# Patient Record
Sex: Female | Born: 1995 | Race: Black or African American | Hispanic: No | Smoking: Former smoker
Health system: Southern US, Community
[De-identification: ages and names within clinical notes are randomized; demographics above are authoritative.]

## PROBLEM LIST (undated history)

## (undated) ENCOUNTER — Inpatient Hospital Stay (HOSPITAL_COMMUNITY): Payer: Self-pay

## (undated) ENCOUNTER — Ambulatory Visit: Admission: EM | Source: Home / Self Care

## (undated) DIAGNOSIS — B009 Herpesviral infection, unspecified: Secondary | ICD-10-CM

## (undated) HISTORY — DX: Herpesviral infection, unspecified: B00.9

---
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
## 2015-04-17 ENCOUNTER — Encounter (HOSPITAL_COMMUNITY): Payer: Self-pay | Admitting: Emergency Medicine

## 2015-04-17 ENCOUNTER — Emergency Department (HOSPITAL_COMMUNITY)
Admission: EM | Admit: 2015-04-17 | Discharge: 2015-04-17 | Discharge status: Against Medical Advice | Payer: Self-pay | Attending: Emergency Medicine | Admitting: Emergency Medicine

## 2015-04-17 DIAGNOSIS — Z720 Tobacco use: Secondary | ICD-10-CM | POA: Insufficient documentation

## 2015-04-17 DIAGNOSIS — R21 Rash and other nonspecific skin eruption: Secondary | ICD-10-CM | POA: Insufficient documentation

## 2015-04-17 NOTE — ED Notes (Signed)
Pt.has been called x3 didn't answer .to go the back to a room. Pt.left

## 2015-04-17 NOTE — ED Notes (Signed)
Patient here with complaint of rash on left lateral abdomen and left leg. Rash appears as small area of dried blisters. Patient states they do not hurt unless pressed on or rubbed against. Denies fevers. Reports period is late and she is concerned about possible pregnancy.

## 2015-07-30 ENCOUNTER — Encounter (HOSPITAL_COMMUNITY): Payer: Self-pay | Admitting: Emergency Medicine

## 2015-07-30 ENCOUNTER — Emergency Department (HOSPITAL_COMMUNITY)
Admission: EM | Admit: 2015-07-30 | Discharge: 2015-07-30 | Disposition: A | Discharge status: Home / Self Care | Payer: 59 | Attending: Emergency Medicine | Admitting: Emergency Medicine

## 2015-07-30 DIAGNOSIS — R1030 Lower abdominal pain, unspecified: Secondary | ICD-10-CM | POA: Insufficient documentation

## 2015-07-30 DIAGNOSIS — R350 Frequency of micturition: Secondary | ICD-10-CM | POA: Insufficient documentation

## 2015-07-30 DIAGNOSIS — Z720 Tobacco use: Secondary | ICD-10-CM | POA: Insufficient documentation

## 2015-07-30 DIAGNOSIS — N898 Other specified noninflammatory disorders of vagina: Secondary | ICD-10-CM | POA: Insufficient documentation

## 2015-07-30 DIAGNOSIS — R300 Dysuria: Secondary | ICD-10-CM

## 2015-07-30 LAB — WET PREP, GENITAL
Clue Cells Wet Prep HPF POC: NONE SEEN
TRICH WET PREP: NONE SEEN
YEAST WET PREP: NONE SEEN

## 2015-07-30 LAB — URINALYSIS, ROUTINE W REFLEX MICROSCOPIC
BILIRUBIN URINE: NEGATIVE
Glucose, UA: NEGATIVE mg/dL
KETONES UR: NEGATIVE mg/dL
NITRITE: NEGATIVE
PH: 6 (ref 5.0–8.0)
PROTEIN: NEGATIVE mg/dL
Specific Gravity, Urine: 1.023 (ref 1.005–1.030)
Urobilinogen, UA: 1 mg/dL (ref 0.0–1.0)

## 2015-07-30 LAB — URINE MICROSCOPIC-ADD ON

## 2015-07-30 LAB — PREGNANCY, URINE: Preg Test, Ur: NEGATIVE

## 2015-07-30 MED ORDER — PHENAZOPYRIDINE HCL 200 MG PO TABS: 200.0000 mg | ORAL_TABLET | Freq: Three times a day (TID) | ORAL | Status: DC

## 2015-07-30 MED ORDER — SULFAMETHOXAZOLE-TRIMETHOPRIM 800-160 MG PO TABS: 1.0000 | ORAL_TABLET | Freq: Two times a day (BID) | ORAL | Status: AC

## 2015-07-30 NOTE — ED Notes (Signed)
Pt is in stable condition upon d/c and ambulates from ED. 

## 2015-07-30 NOTE — Discharge Instructions (Signed)
Read the information below.  Use the prescribed medication as directed.  Please discuss all new medications with your pharmacist.  You may return to the Emergency Department at any time for worsening condition or any new symptoms that concern you.   If you develop high fevers, worsening abdominal pain, uncontrolled vomiting, or are unable to tolerate fluids by mouth, return to the ER for a recheck.     Dysuria Dysuria is pain or discomfort while urinating. The pain or discomfort may be felt in the tube that carries urine out of the bladder (urethra) or in the surrounding tissue of the genitals. The pain may also be felt in the groin area, lower abdomen, and lower back. You may have to urinate frequently or have the sudden feeling that you have to urinate (urgency). Dysuria can affect both men and women, but is more common in women. Dysuria can be caused by many different things, including:  Urinary tract infection in women.  Infection of the kidney or bladder.  Kidney stones or bladder stones.  Certain sexually transmitted infections (STIs), such as chlamydia.  Dehydration.  Inflammation of the vagina.  Use of certain medicines.  Use of certain soaps or scented products that cause irritation. HOME CARE INSTRUCTIONS Watch your dysuria for any changes. The following actions may help to reduce any discomfort you are feeling:  Drink enough fluid to keep your urine clear or pale yellow.  Empty your bladder often. Avoid holding urine for long periods of time.  After a bowel movement or urination, women should cleanse from front to back, using each tissue only once.  Empty your bladder after sexual intercourse.  Take medicines only as directed by your health care provider.  If you were prescribed an antibiotic medicine, finish it all even if you start to feel better.  Avoid caffeine, tea, and alcohol. They can irritate the bladder and make dysuria worse. In men, alcohol may irritate the  prostate.  Keep all follow-up visits as directed by your health care provider. This is important.  If you had any tests done to find the cause of dysuria, it is your responsibility to obtain your test results. Ask the lab or department performing the test when and how you will get your results. Talk with your health care provider if you have any questions about your results. SEEK MEDICAL CARE IF:  You develop pain in your back or sides.  You have a fever.  You have nausea or vomiting.  You have blood in your urine.  You are not urinating as often as you usually do. SEEK IMMEDIATE MEDICAL CARE IF:  You pain is severe and not relieved with medicines.  You are unable to hold down any fluids.  You or someone else notices a change in your mental function.  You have a rapid heartbeat at rest.  You have shaking or chills.  You feel extremely weak.   This information is not intended to replace advice given to you by your health care provider. Make sure you discuss any questions you have with your health care provider.   Document Released: 06/08/2004 Document Revised: 10/01/2014 Document Reviewed: 05/06/2014 Elsevier Interactive Patient Education [employer]2016 Elsevier Inc.

## 2015-07-30 NOTE — ED Notes (Signed)
Patient reports blood in her urine and lower abdominal pain since yesterday. Pt denies n/v or fever.

## 2015-07-30 NOTE — ED Notes (Signed)
Pt states results need to be called to [phone].

## 2015-07-30 NOTE — ED Provider Notes (Signed)
CSN: [id number]     Arrival date & time 07/30/15  4098 History   First MD Initiated Contact with Patient 07/30/15 [phone]     Chief Complaint  Patient presents with  . Hematuria     (Consider location/radiation/quality/duration/timing/severity/associated sxs/prior Treatment) The history is provided by the patient.     Pt presents with suprapubic pain, urinary frequency, urgency, dysuria that began last night.  Saw blood on the toilet paper after urinating.  Unsure if vaginal or urethral blood.  Has had abnormal vaginal discharge x 2 weeks, described as dark.  LMP 07/15/15 was on time and normal.  Is sexually active, does not use protection.  Denies known fevers, N/V, change in bowel habits.   History reviewed. No pertinent past medical history. History reviewed. No pertinent past surgical history. Family History  Problem Relation Age of Onset  . Hypertension Father    Social History  Substance Use Topics  . Smoking status: Current Every Day Smoker    Types: Cigarettes  . Smokeless tobacco: None  . Alcohol Use: No   OB History    No data available     Review of Systems  All other systems reviewed and are negative.     Allergies  Review of patient's allergies indicates no known allergies.  Home Medications   Prior to Admission medications   Medication Sig Start Date End Date Taking? Authorizing Provider  ciprofloxacin (CIPRO) 500 MG tablet Take 1 tablet (500 mg total) by mouth 2 (two) times daily. Patient not taking: Reported on 07/30/2015 08/12/13   Marny Lowenstein, PA-C   BP 128/83 mmHg  Pulse 73  Temp(Src) 97.1 F (36.2 C) (Oral)  Resp 12  SpO2 99%  LMP 07/15/2015 (Exact Date) Physical Exam  Constitutional: She appears well-developed and well-nourished. No distress.  HENT:  Head: Normocephalic and atraumatic.  Neck: Neck supple.  Pulmonary/Chest: Effort normal.  Abdominal: Soft. She exhibits no distension and no mass. There is tenderness in the suprapubic  area. There is no rebound and no guarding.  Genitourinary: Uterus is tender. Cervix exhibits no motion tenderness. Right adnexum displays no mass and no tenderness. Left adnexum displays no mass, no tenderness and no fullness. No erythema, tenderness or bleeding in the vagina. No foreign body around the vagina. No signs of injury around the vagina. Vaginal discharge found.  Small amount thick/dry white discharge.  Neurological: She is alert.  Skin: She is not diaphoretic.  Nursing note and vitals reviewed.   ED Course  Procedures (including critical care time) Labs Review Labs Reviewed  WET PREP, GENITAL - Abnormal; Notable for the following:    WBC, Wet Prep HPF POC FEW (*)    All other components within normal limits  URINALYSIS, ROUTINE W REFLEX MICROSCOPIC (NOT AT [facility]) - Abnormal; Notable for the following:    APPearance CLOUDY (*)    Hgb urine dipstick LARGE (*)    Leukocytes, UA MODERATE (*)    All other components within normal limits  URINE MICROSCOPIC-ADD ON - Abnormal; Notable for the following:    Squamous Epithelial / LPF MANY (*)    Bacteria, UA FEW (*)    All other components within normal limits  URINE CULTURE  PREGNANCY, URINE  HIV ANTIBODY (ROUTINE TESTING)  RPR  GC/CHLAMYDIA PROBE AMP ([place]) NOT AT [facility]    Imaging Review No results found. I have personally reviewed and evaluated these images and lab results as part of my medical decision-making.   EKG Interpretation None  MDM   Final diagnoses:  Dysuria    Afebrile, nontoxic patient with urinary symptoms and abnormal vaginal discharge.  UA with 21-50 WBC but few bacteria, with moderate leukocytes. Given symptoms will treat.  Culture pending.  Upreg negative.  Wet prepunremarkable.  HIV/STD pending.    D/C home with bactrim x 3 days, pyridium.  PCP follow up.  Discussed result, findings, treatment, and follow up  with patient.  Pt given return precautions.  Pt verbalizes understanding and  agrees with plan.         Trixie Dredgemily Marce Schartz, PA-C 07/30/15 1249  Mirian MoMatthew Gentry, MD 08/04/15 1415

## 2015-07-31 LAB — RPR: RPR: NONREACTIVE

## 2015-07-31 LAB — HIV ANTIBODY (ROUTINE TESTING W REFLEX): HIV Screen 4th Generation wRfx: NONREACTIVE

## 2015-08-01 LAB — URINE CULTURE: Culture: >=100000

## 2015-08-01 LAB — GC/CHLAMYDIA PROBE AMP (~~LOC~~) NOT AT ARMC
Chlamydia: POSITIVE — AB
NEISSERIA GONORRHEA: NEGATIVE

## 2015-08-02 ENCOUNTER — Telehealth (HOSPITAL_BASED_OUTPATIENT_CLINIC_OR_DEPARTMENT_OTHER): Payer: Self-pay | Admitting: Emergency Medicine

## 2015-08-02 NOTE — Telephone Encounter (Signed)
Post ED Visit - Positive Culture Follow-up  Culture report reviewed by antimicrobial stewardship pharmacist:  []  Rebecca Salazar, Pharm.D. []  Rebecca Salazar, Pharm.D., BCPS []  Rebecca Salazar, Pharm.D. []  Rebecca Salazar, Pharm.D., BCPS []  Rebecca Salazar, [address] BoulevardPharm.D., BCPS, AAHIVP []  Rebecca Salazar, Pharm.D., BCPS, AAHIVP []  Rebecca Salazar, Pharm.D. [x]  Rebecca Salazar, [place].  Positive urine culture proteus Treated with bactrim DS, organism sensitive to the same and no further patient follow-up is required at this time.  Rebecca Salazar, Rebecca Salazar 08/02/2015, 9:05 AM

## 2015-08-02 NOTE — Telephone Encounter (Addendum)
Chart handoff to EDP for treatment plan for + Chlamydia 

## 2015-08-08 NOTE — Telephone Encounter (Signed)
Chart reviewed by Dr Dalene SeltzerSchlossman. Orders given for azithromycin 1 gram po x 1.  Spoke with Rebecca Salazar. Verified ID. Informed of labs. . Rebecca Salazar informed to abstain from sexual activity x 10 days and to notify partner for testing and treatment. Medication called to [facility] on [address]. Spoke with pharmacist

## 2015-09-25 NOTE — L&D Delivery Note (Signed)
Delivery Note At  a viable female was delivered via  (Presentation: ;  ).  APGAR:8 ,9 ; weight  pending.  Tight shoulders with resolution using mcroberts. Placenta status:complete , . 3v Cord:trailing membranes teased out with rings forceps  with the following complications:uterine atony after delivery, more membrane removed with manual sweep .    Anesthesia:  Epidural Episiotomy:None   Lacerations:  None  Est. Blood Loss (mL):300cc   Cytotec placed rectally Mom to postpartum.  Baby to Couplet care / Skin to Skin.  Henderson Newcomerancy Jean Eline Geng 09/01/2016, 3:00 PM

## 2015-11-30 ENCOUNTER — Emergency Department (HOSPITAL_COMMUNITY)
Admission: EM | Admit: 2015-11-30 | Discharge: 2015-11-30 | Disposition: A | Discharge status: Home / Self Care | Payer: 59 | Attending: Emergency Medicine | Admitting: Emergency Medicine

## 2015-11-30 ENCOUNTER — Encounter (HOSPITAL_COMMUNITY): Payer: Self-pay | Admitting: *Deleted

## 2015-11-30 DIAGNOSIS — R0981 Nasal congestion: Secondary | ICD-10-CM | POA: Diagnosis not present

## 2015-11-30 DIAGNOSIS — R51 Headache: Secondary | ICD-10-CM | POA: Diagnosis not present

## 2015-11-30 DIAGNOSIS — R059 Cough, unspecified: Secondary | ICD-10-CM

## 2015-11-30 DIAGNOSIS — F17210 Nicotine dependence, cigarettes, uncomplicated: Secondary | ICD-10-CM | POA: Diagnosis not present

## 2015-11-30 DIAGNOSIS — R05 Cough: Secondary | ICD-10-CM | POA: Diagnosis not present

## 2015-11-30 DIAGNOSIS — R6889 Other general symptoms and signs: Secondary | ICD-10-CM

## 2015-11-30 DIAGNOSIS — Z79899 Other long term (current) drug therapy: Secondary | ICD-10-CM | POA: Insufficient documentation

## 2015-11-30 MED ORDER — OSELTAMIVIR PHOSPHATE 75 MG PO CAPS: 75.0000 mg | ORAL_CAPSULE | Freq: Two times a day (BID) | ORAL | Status: DC

## 2015-11-30 NOTE — ED Notes (Signed)
Pt reports cough, congestions, headaches, chills and nausea for 3 days. Pt denies vomiting. NAD noted.

## 2015-11-30 NOTE — ED Provider Notes (Signed)
CSN: [id number]     Arrival date & time 11/30/15  0751 History   First MD Initiated Contact with Patient 11/30/15 0802     Chief Complaint  Patient presents with  . Cough     (Consider location/radiation/quality/duration/timing/severity/associated sxs/prior Treatment) The history is provided by the patient and medical records.    20 year old female here with flulike symptoms that began yesterday-- specifically she has had productive cough, nasal congestion, headache, subjective fever/chills, and body aches.  Reports her best friend and boyfriend have both been sick with the flu recently.  She denies any chest pain or SOB.  No abdominal pain, nausea, vomiting, or diarrhea. No intervention tried PTA.  VSS.  History reviewed. No pertinent past medical history. History reviewed. No pertinent past surgical history. Family History  Problem Relation Age of Onset  . Hypertension Father    Social History  Substance Use Topics  . Smoking status: Current Every Day Smoker    Types: Cigarettes  . Smokeless tobacco: None  . Alcohol Use: No   OB History    No data available     Review of Systems  HENT: Positive for congestion.   Respiratory: Positive for cough.   Musculoskeletal: Positive for myalgias.  Neurological: Positive for headaches.  All other systems reviewed and are negative.     Allergies  Review of patient's allergies indicates no known allergies.  Home Medications   Prior to Admission medications   Medication Sig Start Date End Date Taking? Authorizing Provider  phenazopyridine (PYRIDIUM) 200 MG tablet Take 1 tablet (200 mg total) by mouth 3 (three) times daily. 07/30/15   Trixie Dredge, PA-C   BP 121/84 mmHg  Pulse 100  Temp(Src) 99 F (37.2 C) (Oral)  Resp 18  SpO2 96%   Physical Exam  Constitutional: She is oriented to person, place, and time. She appears well-developed and well-nourished. No distress.  HENT:  Head: Normocephalic and atraumatic.  Right Ear:  Tympanic membrane, external ear and ear canal normal.  Left Ear: Tympanic membrane, external ear and ear canal normal.  Nose: Mucosal edema and rhinorrhea present.  Mouth/Throat: Uvula is midline, oropharynx is clear and moist and mucous membranes are normal. No oropharyngeal exudate, posterior oropharyngeal edema, posterior oropharyngeal erythema or tonsillar abscesses.  + nasal congestion, clear rhinorrhea  Eyes: Conjunctivae and EOM are normal. Pupils are equal, round, and reactive to light.  Neck: Normal range of motion and full passive range of motion without pain. Neck supple. No rigidity.  No rigidity, no meningismus  Cardiovascular: Normal rate, regular rhythm and normal heart sounds.   No murmur heard. Pulmonary/Chest: Effort normal and breath sounds normal. No respiratory distress. She has no wheezes. She has no rhonchi.  Abdominal: Soft. Bowel sounds are normal. There is no tenderness. There is no guarding.  Musculoskeletal: Normal range of motion. She exhibits no edema.  Neurological: She is alert and oriented to person, place, and time. She has normal strength. She displays no tremor. No cranial nerve deficit or sensory deficit. She displays no seizure activity.  AAOx3, answering questions appropriately; equal strength UE and LE bilaterally; CN grossly intact; moves all extremities appropriately without ataxia; no focal neuro deficits or facial asymmetry appreciated   Skin: Skin is warm and dry. No rash noted. She is not diaphoretic.  Psychiatric: She has a normal mood and affect. Her behavior is normal. Thought content normal.  Nursing note and vitals reviewed.   ED Course  Procedures (including critical care time) Labs Review Labs  Reviewed - No data to display  Imaging Review No results found. I have personally reviewed and evaluated these images and lab results as part of my medical decision-making.   EKG Interpretation None      MDM   Final diagnoses:  Cough   Flu-like symptoms   20 year old female who were flulike symptoms that began yesterday (triage note reports 3 days ago, patient is insistent it began yesterday). Recent sick contacts with known flu. Patient is afebrile, nontoxic. Her exam is overall benign. No chest pain or shortness of breath noted. Headache without neurologic deficits or clinical manifestations of meningitis. I suspect this is a viral process, possibly flu given her sick contacts. Discussed risks versus benefit of Tamiflu given she is still within 48 hour window, patient wished to try this. Encouraged rest, fluids, and supportive care at home. Follow-up with PCP.  Discussed plan with patient, he/she acknowledged understanding and agreed with plan of care.  Return precautions given for new or worsening symptoms.  Garlon HatchetLisa M Smriti Barkow, PA-C 11/30/15 [phone]  Rolland PorterMark James, MD 12/06/15 2014

## 2015-11-30 NOTE — Discharge Instructions (Signed)
May take the tamiflu to help with symptoms.  It may give your some diarrhea. Drink fluids and rest. Return here for any new/worsening symptoms.

## 2016-01-12 DIAGNOSIS — Z3490 Encounter for supervision of normal pregnancy, unspecified, unspecified trimester: Secondary | ICD-10-CM | POA: Insufficient documentation

## 2016-01-12 LAB — OB RESULTS CONSOLE ANTIBODY SCREEN: Antibody Screen: NEGATIVE

## 2016-01-12 LAB — OB RESULTS CONSOLE ABO/RH: RH Type: POSITIVE

## 2016-01-12 LAB — OB RESULTS CONSOLE HEPATITIS B SURFACE ANTIGEN: Hepatitis B Surface Ag: NEGATIVE

## 2016-01-12 LAB — OB RESULTS CONSOLE RPR: RPR: NONREACTIVE

## 2016-01-12 LAB — OB RESULTS CONSOLE GC/CHLAMYDIA
CHLAMYDIA, DNA PROBE: NEGATIVE
Gonorrhea: NEGATIVE

## 2016-01-12 LAB — OB RESULTS CONSOLE HIV ANTIBODY (ROUTINE TESTING): HIV: NONREACTIVE

## 2016-01-12 LAB — OB RESULTS CONSOLE RUBELLA ANTIBODY, IGM: Rubella: IMMUNE

## 2016-01-20 ENCOUNTER — Encounter (HOSPITAL_COMMUNITY): Payer: Self-pay

## 2016-01-20 ENCOUNTER — Emergency Department (HOSPITAL_COMMUNITY)
Admission: EM | Admit: 2016-01-20 | Discharge: 2016-01-20 | Disposition: A | Discharge status: Home / Self Care | Payer: 59 | Attending: Emergency Medicine | Admitting: Emergency Medicine

## 2016-01-20 DIAGNOSIS — H578 Other specified disorders of eye and adnexa: Secondary | ICD-10-CM | POA: Diagnosis present

## 2016-01-20 DIAGNOSIS — H109 Unspecified conjunctivitis: Secondary | ICD-10-CM | POA: Diagnosis not present

## 2016-01-20 DIAGNOSIS — F17210 Nicotine dependence, cigarettes, uncomplicated: Secondary | ICD-10-CM | POA: Insufficient documentation

## 2016-01-20 DIAGNOSIS — Z79899 Other long term (current) drug therapy: Secondary | ICD-10-CM | POA: Diagnosis not present

## 2016-01-20 MED ORDER — FLUORESCEIN SODIUM 1 MG OP STRP
1.0000 | ORAL_STRIP | Freq: Once | OPHTHALMIC | Status: AC
  Administered 2016-01-20: 1 via OPHTHALMIC
  Filled 2016-01-20: qty 1

## 2016-01-20 MED ORDER — ERYTHROMYCIN 5 MG/GM OP OINT: TOPICAL_OINTMENT | OPHTHALMIC | Status: DC

## 2016-01-20 MED ORDER — TETRACAINE HCL 0.5 % OP SOLN
1.0000 [drp] | Freq: Once | OPHTHALMIC | Status: AC
  Administered 2016-01-20: 1 [drp] via OPHTHALMIC
  Filled 2016-01-20: qty 2

## 2016-01-20 NOTE — ED Provider Notes (Signed)
CSN: [id number]     Arrival date & time 01/20/16  1708 History  By signing my name below, I, Placido Sou, attest that this documentation has been prepared under the direction and in the presence of Ryatt Corsino Y Walterine Amodei, [place]. Electronically Signed: Placido Sou, ED Scribe. 01/20/2016. 5:40 PM.   Chief Complaint  Patient presents with  . Conjunctivitis   The history is provided by the patient. No language interpreter was used.   HPI Comments: Rebecca Salazar is a 20 y.o. female who presents to the Emergency Department complaining of gradual onset, mild, left eye redness and irritation x 1 day. She reports associated, mild, drainage from the left eye. Pt confirms wearing contact lenses and currently has them in place. She denies eye itchiness, right eye symptoms, visual disturbances or any other associated symptoms at this time.   No past medical history on file. No past surgical history on file. Family History  Problem Relation Age of Onset  . Hypertension Father    Social History  Substance Use Topics  . Smoking status: Current Every Day Smoker    Types: Cigarettes  . Smokeless tobacco: Not on file  . Alcohol Use: No   OB History    No data available     Review of Systems A complete 10 system review of systems was obtained and all systems are negative except as noted in the HPI and PMH.   Allergies  Review of patient's allergies indicates no known allergies.  Home Medications   Prior to Admission medications   Medication Sig Start Date End Date Taking? Authorizing Provider  oseltamivir (TAMIFLU) 75 MG capsule Take 1 capsule (75 mg total) by mouth every 12 (twelve) hours. 11/30/15   Garlon Hatchet, PA-C  phenazopyridine (PYRIDIUM) 200 MG tablet Take 1 tablet (200 mg total) by mouth 3 (three) times daily. 07/30/15   Trixie Dredge, PA-C   BP 122/76 mmHg  Pulse 103  Temp(Src) 98.7 F (37.1 C) (Oral)  Resp 14  SpO2 100%  LMP 11/18/2015   Physical Exam  Constitutional:  She is oriented to person, place, and time. She appears well-developed and well-nourished.  HENT:  Head: Normocephalic and atraumatic.  Eyes: EOM are normal.  Left conjunctival injection No discharge EOM intact No periorbital edema or erythema No fluroescein uptake  Neck: Normal range of motion.  Cardiovascular: Normal rate.   Pulmonary/Chest: Effort normal. No respiratory distress.  Abdominal: Soft.  Musculoskeletal: Normal range of motion.  Neurological: She is alert and oriented to person, place, and time.  Skin: Skin is warm and dry.  Psychiatric: She has a normal mood and affect.  Nursing note and vitals reviewed.    Visual Acuity  Right Eye Distance: 20/20 Left Eye Distance: 20/20 Bilateral Distance:    Right Eye Near:   Left Eye Near:    Bilateral Near:      ED Course  Procedures  DIAGNOSTIC STUDIES: Oxygen Saturation is 100% on RA, normal by my interpretation.    COORDINATION OF CARE: 5:39 PM Discussed next steps with pt. She verbalized understanding and is agreeable with the plan.   Labs Review Labs Reviewed - No data to display  Imaging Review No results found.   EKG Interpretation None      MDM   Final diagnoses:  Conjunctivitis of left eye    Patient presentation consistent with conjunctivitis.  No evidence of corneal abrasions, entrapment, consensual photophobia, or herpes keratitis.  Presentation not concerning for iritis, or corneal abrasions.  Pt  discharged with erythromycin opthalmic ointment.  Personal hygiene and frequent handwashing discussed.  Patient advised to follow up with ophthalmologist if symptoms persist or worsen. Return precautions discussed.  Patient verbalizes understanding and is agreeable with discharge.  I personally performed the services described in this documentation, which was scribed in my presence. The recorded information has been reviewed and is accurate.   Carlene CoriaSerena Y Deriyah Kunath, PA-C 01/20/16 2207

## 2016-01-20 NOTE — ED Notes (Signed)
Lt,. Eye is tearing and pink.  Blurred vision to lt. Eye .  Pt. Wears contacts.

## 2016-01-20 NOTE — Discharge Instructions (Signed)
Please call opthalmology to schedule a follow up appointment within 48 hours. Do not wear your contacts until your symptoms resolve. Use the ointment as prescribed. Return to the ER for new or worsening symptoms.

## 2016-02-12 DIAGNOSIS — Z3480 Encounter for supervision of other normal pregnancy, unspecified trimester: Secondary | ICD-10-CM | POA: Insufficient documentation

## 2016-03-02 ENCOUNTER — Encounter (HOSPITAL_COMMUNITY): Payer: Self-pay

## 2016-04-05 NOTE — ED Provider Notes (Signed)
Medical screening examination/treatment/procedure(s) were performed by non-physician practitioner and as supervising physician I was immediately available for consultation/collaboration.   EKG Interpretation None       Jacalyn LefevreJulie Jourdain Guay, MD 04/05/16 1242

## 2016-07-26 LAB — OB RESULTS CONSOLE GBS: GBS: NEGATIVE

## 2016-08-30 ENCOUNTER — Encounter (HOSPITAL_COMMUNITY): Payer: Self-pay | Admitting: *Deleted

## 2016-08-30 ENCOUNTER — Telehealth (HOSPITAL_COMMUNITY): Payer: Self-pay | Admitting: *Deleted

## 2016-08-30 NOTE — Telephone Encounter (Signed)
Preadmission screen  

## 2016-08-31 ENCOUNTER — Encounter (HOSPITAL_COMMUNITY): Payer: Self-pay

## 2016-08-31 ENCOUNTER — Other Ambulatory Visit: Payer: Self-pay | Admitting: Obstetrics and Gynecology

## 2016-08-31 ENCOUNTER — Inpatient Hospital Stay (HOSPITAL_COMMUNITY)
Admission: AD | Admit: 2016-08-31 | Discharge: 2016-09-03 | DRG: 775 | Disposition: A | Discharge status: Home / Self Care | Payer: 59 | Source: Ambulatory Visit | Attending: Obstetrics & Gynecology | Admitting: Obstetrics & Gynecology

## 2016-08-31 DIAGNOSIS — O480 Post-term pregnancy: Secondary | ICD-10-CM | POA: Diagnosis present

## 2016-08-31 DIAGNOSIS — O99013 Anemia complicating pregnancy, third trimester: Secondary | ICD-10-CM | POA: Diagnosis present

## 2016-08-31 DIAGNOSIS — O9902 Anemia complicating childbirth: Secondary | ICD-10-CM | POA: Diagnosis present

## 2016-08-31 DIAGNOSIS — Z3A41 41 weeks gestation of pregnancy: Secondary | ICD-10-CM | POA: Diagnosis not present

## 2016-08-31 DIAGNOSIS — D649 Anemia, unspecified: Secondary | ICD-10-CM | POA: Diagnosis present

## 2016-08-31 MED ORDER — ACETAMINOPHEN 325 MG PO TABS
650.0000 mg | ORAL_TABLET | ORAL | Status: DC | PRN
  Filled 2016-08-31: qty 2

## 2016-08-31 MED ORDER — OXYTOCIN 40 UNITS IN LACTATED RINGERS INFUSION - SIMPLE MED
2.5000 [IU]/h | INTRAVENOUS | Status: DC
  Filled 2016-08-31: qty 1000

## 2016-08-31 MED ORDER — LACTATED RINGERS IV SOLN
INTRAVENOUS | Status: DC
  Administered 2016-09-01 (×3): via INTRAVENOUS

## 2016-08-31 MED ORDER — ONDANSETRON HCL 4 MG/2ML IJ SOLN: 4.0000 mg | Freq: Four times a day (QID) | INTRAMUSCULAR | Status: DC | PRN

## 2016-08-31 MED ORDER — OXYTOCIN BOLUS FROM INFUSION
500.0000 mL | Freq: Once | INTRAVENOUS | Status: AC
Start: 2016-09-01 — End: 2016-09-01
  Administered 2016-09-01: 500 mL via INTRAVENOUS

## 2016-08-31 MED ORDER — SOD CITRATE-CITRIC ACID 500-334 MG/5ML PO SOLN: 30.0000 mL | ORAL | Status: DC | PRN

## 2016-08-31 MED ORDER — LACTATED RINGERS IV SOLN: 500.0000 mL | INTRAVENOUS | Status: DC | PRN

## 2016-08-31 MED ORDER — FENTANYL CITRATE (PF) 100 MCG/2ML IJ SOLN
50.0000 ug | INTRAMUSCULAR | Status: DC | PRN
  Administered 2016-09-01 (×3): 100 ug via INTRAVENOUS
  Filled 2016-08-31 (×3): qty 2

## 2016-08-31 MED ORDER — LIDOCAINE HCL (PF) 1 % IJ SOLN
30.0000 mL | INTRAMUSCULAR | Status: DC | PRN
  Filled 2016-08-31: qty 30

## 2016-08-31 NOTE — H&P (Signed)
Rebecca Salazar is a 20 y.o. female, G1P0 at 5441 weeks, presenting for IOL secondary to Post Dates pregnancy.  Patient pregnancy history has been complicated by anemia and admitting HgB was 8.4.  History otherwise unremarkable.  GBS negative.  Patient currently desires non pharmacologic coping methods for pain.    Patient Active Problem List   Diagnosis Date Noted  . Anemia 09/01/2016  . Indication for care or intervention in labor or delivery 08/31/2016    History of present pregnancy: Patient entered care at 7.6 weeks.   EDC of 08/24/2016 was established by Definite LMP of 11/18/2015.   Anatomy scan:  19.5 weeks, with normal, but limited findings and an posterior placenta.   Additional US evaluations:  28 wks for spinal views Significant prenatal events: Patient with common pregnancy discomforts to include n/v, fatigue, itching, cramping   Last evaluation:  08/29/2016 in office by L. Clemmons, CNM VE: 1cm / 60% / -2 , BP: 110/64 Wt: 220 lbs TWG: 35lbs  OB History    Gravida Para Term Preterm AB Living   1 0 0 0 0     SAB TAB Ectopic Multiple Live Births   0 0 0         No past medical history on file. No past surgical history on file. Family History: family history includes Hypertension in her father. Social History:  reports that she has been smoking Cigarettes.  She does not have any smokeless tobacco history on file. She reports that she uses drugs, including Marijuana. She reports that she does not drink alcohol.   Prenatal Transfer Tool  Maternal Diabetes: No Genetic Screening: Normal Maternal Ultrasounds/Referrals: Normal Fetal Ultrasounds or other Referrals:  None Maternal Substance Abuse:  No Significant Maternal Medications:  None Significant Maternal Lab Results: Lab values include: Group B Strep negative    ROS:  +FM, -LOF,-VB, -Ctx, -GI Concerns, No recent illness All other systems negative  No Known Allergies     Last menstrual period  11/18/2015.  Physical Exam  Vitals reviewed. Constitutional: She is oriented to person, place, and time. She appears well-developed and well-nourished. No distress.  HENT:  Head: Normocephalic and atraumatic.  Eyes: Conjunctivae are normal.  Neck: Normal range of motion.  Cardiovascular: Normal rate, regular rhythm and normal heart sounds.   Respiratory: Effort normal and breath sounds normal.  GI: Soft. Bowel sounds are normal.  Gravid--fundal height appears AGA, Soft, NT   Genitourinary:  Genitourinary Comments: SVE: 1.5/50/-2, Anterior, Soft  Musculoskeletal: Normal range of motion. She exhibits edema.  Neurological: She is alert and oriented to person, place, and time.  Skin: Skin is warm and dry.  Multiple Tattoos    Leopolds: EFW: 7 1/2-8lbs Presentation: Vertex  FHR: 145 bpm, Mod Var, -Decels, +Accels UCs:  Palpates moderate, Irritability graphed  Prenatal labs: ABO, Rh: O/Positive/-- (04/20 0000) Antibody: Negative (04/20 0000) Rubella:  Immune RPR: Nonreactive (04/20 0000)  HBsAg: Negative (04/20 0000)  HIV: Non-reactive (04/20 0000)  GBS: Negative (11/02 0000) Sickle cell/Hgb electrophoresis:  Normal Pap:  N/A d/t Age GC:  NR Chlamydia:  NR Other:  None    Assessment IUP at 41wks Cat I FT Post Dates IOL Cervical Ripening Bishop Score: 7 Anemia  Plan[facility] consult with Dr. Kathie RhodesS. Rivard Routine Labor and Delivery Orders per [facility] Protocol In room to complete assessment and discuss POC: -Discussed r/b of induction including fetal distress, serial induction, pain, and increased risk of c/s delivery -Discussed induction methods including  cervical ripening agents, foley bulbs, and pitocin -Patient verbalizes understanding and wishes to proceed with induction process -Foley bulb placed without difficulty; infused with 60mL (uterine) and 40mL (vaginal) -Will await for spontaneous expulsion or remove after 12 hours -Will reassess in 6  hrs or prn for pitocin initiation  -Patient accepts blood products in an emergency -Encouraged to rest and informed of availability of sleep aides -Informed of availability of IV pain medication if desired  Cherre RobinsJessica L Meeghan Skipper CNM, MSN 08/31/2016, 10:52 PM

## 2016-09-01 ENCOUNTER — Encounter (HOSPITAL_COMMUNITY): Payer: Self-pay | Admitting: Anesthesiology

## 2016-09-01 ENCOUNTER — Inpatient Hospital Stay (HOSPITAL_COMMUNITY): Admission: RE | Admit: 2016-09-01 | Payer: 59 | Source: Ambulatory Visit

## 2016-09-01 ENCOUNTER — Inpatient Hospital Stay (HOSPITAL_COMMUNITY): Payer: 59 | Admitting: Anesthesiology

## 2016-09-01 DIAGNOSIS — O99013 Anemia complicating pregnancy, third trimester: Secondary | ICD-10-CM | POA: Diagnosis present

## 2016-09-01 DIAGNOSIS — D649 Anemia, unspecified: Secondary | ICD-10-CM | POA: Diagnosis present

## 2016-09-01 LAB — RAPID HIV SCREEN (HIV 1/2 AB+AG)
HIV 1/2 Antibodies: NONREACTIVE
HIV-1 P24 ANTIGEN - HIV24: NONREACTIVE

## 2016-09-01 LAB — TYPE AND SCREEN
ABO/RH(D): O POS
Antibody Screen: NEGATIVE

## 2016-09-01 LAB — CBC
HEMATOCRIT: 27.5 % — AB (ref 36.0–46.0)
HEMOGLOBIN: 8.4 g/dL — AB (ref 12.0–15.0)
MCH: 22 pg — ABNORMAL LOW (ref 26.0–34.0)
MCHC: 30.5 g/dL (ref 30.0–36.0)
MCV: 72 fL — AB (ref 78.0–100.0)
PLATELETS: 323 10*3/uL (ref 150–400)
RBC: 3.82 MIL/uL — AB (ref 3.87–5.11)
RDW: 21.3 % — ABNORMAL HIGH (ref 11.5–15.5)
WBC: 13.7 10*3/uL — AB (ref 4.0–10.5)

## 2016-09-01 LAB — RPR: RPR: NONREACTIVE

## 2016-09-01 MED ORDER — ACETAMINOPHEN 325 MG PO TABS: 650.0000 mg | ORAL_TABLET | ORAL | Status: DC | PRN

## 2016-09-01 MED ORDER — SENNOSIDES-DOCUSATE SODIUM 8.6-50 MG PO TABS
2.0000 | ORAL_TABLET | ORAL | Status: DC
  Administered 2016-09-02 (×2): 2 via ORAL
  Filled 2016-09-01 (×2): qty 2

## 2016-09-01 MED ORDER — ZOLPIDEM TARTRATE 5 MG PO TABS: 5.0000 mg | ORAL_TABLET | Freq: Every evening | ORAL | Status: DC | PRN

## 2016-09-01 MED ORDER — EPHEDRINE 5 MG/ML INJ
10.0000 mg | INTRAVENOUS | Status: DC | PRN
  Filled 2016-09-01: qty 4

## 2016-09-01 MED ORDER — OXYTOCIN 40 UNITS IN LACTATED RINGERS INFUSION - SIMPLE MED
1.0000 m[IU]/min | INTRAVENOUS | Status: DC
  Administered 2016-09-01: 2 m[IU]/min via INTRAVENOUS

## 2016-09-01 MED ORDER — ONDANSETRON HCL 4 MG/2ML IJ SOLN
4.0000 mg | INTRAMUSCULAR | Status: DC | PRN
Start: 2016-09-01 — End: 2016-09-03

## 2016-09-01 MED ORDER — ONDANSETRON HCL 4 MG PO TABS: 4.0000 mg | ORAL_TABLET | ORAL | Status: DC | PRN

## 2016-09-01 MED ORDER — MISOPROSTOL 200 MCG PO TABS
ORAL_TABLET | ORAL | Status: AC
  Filled 2016-09-01: qty 5

## 2016-09-01 MED ORDER — TERBUTALINE SULFATE 1 MG/ML IJ SOLN
0.2500 mg | Freq: Once | INTRAMUSCULAR | Status: DC | PRN
  Filled 2016-09-01: qty 1

## 2016-09-01 MED ORDER — FENTANYL 2.5 MCG/ML BUPIVACAINE 1/10 % EPIDURAL INFUSION (WH - ANES)
14.0000 mL/h | INTRAMUSCULAR | Status: DC | PRN
  Administered 2016-09-01 (×3): 14 mL/h via EPIDURAL
  Filled 2016-09-01 (×2): qty 100

## 2016-09-01 MED ORDER — MISOPROSTOL 25 MCG QUARTER TABLET
25.0000 ug | ORAL_TABLET | ORAL | Status: DC | PRN
  Filled 2016-09-01: qty 1
  Filled 2016-09-01: qty 0.25

## 2016-09-01 MED ORDER — PHENYLEPHRINE 40 MCG/ML (10ML) SYRINGE FOR IV PUSH (FOR BLOOD PRESSURE SUPPORT)
80.0000 ug | PREFILLED_SYRINGE | INTRAVENOUS | Status: DC | PRN
  Filled 2016-09-01: qty 5
  Filled 2016-09-01: qty 10

## 2016-09-01 MED ORDER — BENZOCAINE-MENTHOL 20-0.5 % EX AERO: 1.0000 "application " | INHALATION_SPRAY | CUTANEOUS | Status: DC | PRN

## 2016-09-01 MED ORDER — PRENATAL MULTIVITAMIN CH
1.0000 | ORAL_TABLET | Freq: Every day | ORAL | Status: DC
  Administered 2016-09-02: 1 via ORAL
  Filled 2016-09-01 (×2): qty 1

## 2016-09-01 MED ORDER — LACTATED RINGERS IV SOLN: 500.0000 mL | Freq: Once | INTRAVENOUS | Status: DC

## 2016-09-01 MED ORDER — MISOPROSTOL 200 MCG PO TABS
1000.0000 ug | ORAL_TABLET | Freq: Once | ORAL | Status: AC
  Administered 2016-09-01: 1000 ug via RECTAL

## 2016-09-01 MED ORDER — DIPHENHYDRAMINE HCL 25 MG PO CAPS
25.0000 mg | ORAL_CAPSULE | Freq: Four times a day (QID) | ORAL | Status: DC | PRN
  Filled 2016-09-01: qty 1

## 2016-09-01 MED ORDER — TETANUS-DIPHTH-ACELL PERTUSSIS 5-2.5-18.5 LF-MCG/0.5 IM SUSP: 0.5000 mL | Freq: Once | INTRAMUSCULAR | Status: DC

## 2016-09-01 MED ORDER — PHENYLEPHRINE 40 MCG/ML (10ML) SYRINGE FOR IV PUSH (FOR BLOOD PRESSURE SUPPORT)
80.0000 ug | PREFILLED_SYRINGE | INTRAVENOUS | Status: DC | PRN
  Filled 2016-09-01: qty 5

## 2016-09-01 MED ORDER — IBUPROFEN 600 MG PO TABS
600.0000 mg | ORAL_TABLET | Freq: Four times a day (QID) | ORAL | Status: DC
  Administered 2016-09-01 – 2016-09-03 (×8): 600 mg via ORAL
  Filled 2016-09-01 (×8): qty 1

## 2016-09-01 MED ORDER — SIMETHICONE 80 MG PO CHEW: 80.0000 mg | CHEWABLE_TABLET | ORAL | Status: DC | PRN

## 2016-09-01 MED ORDER — WITCH HAZEL-GLYCERIN EX PADS: 1.0000 "application " | MEDICATED_PAD | CUTANEOUS | Status: DC | PRN

## 2016-09-01 MED ORDER — DIPHENHYDRAMINE HCL 50 MG/ML IJ SOLN: 12.5000 mg | INTRAMUSCULAR | Status: DC | PRN

## 2016-09-01 MED ORDER — LIDOCAINE HCL (PF) 1 % IJ SOLN
INTRAMUSCULAR | Status: DC | PRN
Start: 2016-09-01 — End: 2016-09-01
  Administered 2016-09-01: 9 mL via EPIDURAL
  Administered 2016-09-01: 7 mL via EPIDURAL

## 2016-09-01 MED ORDER — COCONUT OIL OIL: 1.0000 "application " | TOPICAL_OIL | Status: DC | PRN

## 2016-09-01 MED ORDER — DIBUCAINE 1 % RE OINT: 1.0000 "application " | TOPICAL_OINTMENT | RECTAL | Status: DC | PRN

## 2016-09-01 NOTE — Progress Notes (Signed)
Rebecca Salazar MRN: [id number]  Subjective: -Nurse call reports foley bulb expulsion.  In room to assess.  Patient reports some discomfort.   Objective: BP 125/72   Pulse 89   Temp 98.9 F (37.2 C) (Oral)   Resp 16   Ht 5\' 9"  (1.753 m)   Wt 99.8 kg (220 lb)   LMP 11/18/2015   BMI 32.49 kg/m  No intake/output data recorded. No intake/output data recorded.  Fetal Monitoring: FHT: 125 bpm, Mod Var, -Decels, +Accels UC: Irritability graphed    Vaginal Exam: SVE:   Dilation: 5 Effacement (%): 70 Station: -2 Exam by:: Rebecca Salazar, CNM Membranes:AROM of small amt blood tinged fluid Internal Monitors: IUPC attempted, but unsuccessful d/t patient discomfort  Augmentation/Induction: Pitocin:Initiated Cytotec: None S/P Foley Bulb  Assessment:  IUP at 41.1wks Cat I FT  Postdates IOL Amniotomy  Plan: -Discussed AROM r/b including increased risk of infection, cord prolapse, fetal intolerance, and decreased labor time. No questions or concerns and patient desires to proceed with AROM.  -Discussed IUPC r/b, prior to insertion, including increased risk of infection and ability to adequately monitor quantity and strength of contractions. -Start pitocin titration per orders -Okay for epidural if desired -Continue other mgmt as ordered  Valma CavaJessica L Jeany Seville,MSN, CNM 09/01/2016, 4:06 AM

## 2016-09-01 NOTE — Anesthesia Pain Management Evaluation Note (Signed)
  CRNA Pain Management Visit Note  Patient: Rebecca Salazar, 20 y.o., female  "[facility] We have an anesthesia team available at all times to provide care throughout the hospital, including epidural management and anesthesia for C-section. I don't know your plan for the delivery whether it a natural birth, water birth, IV sedation, nitrous supplementation, doula or epidural, but we want to meet your pain goals."   1.Was your pain managed to your expectations on prior hospitalizations?   No prior hospitalizations  2.What is your expectation for pain management during this hospitalization?     Epidural  3.How can we help you reach that goal? unsure  Record the patient's initial score and the patient's pain goal.   Pain: 4  Pain Goal: 9 [facility] wants you to be able to say your pain was always managed very well.  Cephus ShellingBURGER,Keiland Pickering 09/01/2016

## 2016-09-01 NOTE — Progress Notes (Signed)
Subjective: Pt comfortable with epidural.  Denies rectal pressure   Objective: BP 126/72   Pulse 85   Temp 99 F (37.2 C) (Oral)   Resp 18   Ht 5\' 9"  (1.753 m)   Wt 99.8 kg (220 lb)   LMP 11/18/2015   SpO2 98%   BMI 32.49 kg/m  I/O last 3 completed shifts: In: 1015.4 [P.O.:480; I.V.:535.4] Out: -  No intake/output data recorded.  FHT: Category 1 UC:   regular, every 2-3 minutes SVE:   Dilation: Lip/rim Effacement (%): 70 Station: -2 Exam by:: Bernerd PhoNancy Prothero CNM Pitocin at 4 mu   Assessment:  Active labor at term Cat 1 strip Plan: Monitor progress Kenney HousemanNancy Jean Prothero CNM, MSN 09/01/2016, 11:30 AM

## 2016-09-01 NOTE — Anesthesia Preprocedure Evaluation (Signed)
Anesthesia Evaluation  Patient identified by MRN, date of birth, ID band Patient awake    Reviewed: Allergy & Precautions, H&P , NPO status , Patient's Chart, lab work & pertinent test results  Airway Mallampati: II  TM Distance: >3 FB Neck ROM: full    Dental no notable dental hx.    Pulmonary neg pulmonary ROS, former smoker,    Pulmonary exam normal        Cardiovascular negative cardio ROS Normal cardiovascular exam     Neuro/Psych negative neurological ROS  negative psych ROS   GI/Hepatic negative GI ROS, Neg liver ROS,   Endo/Other  negative endocrine ROS  Renal/GU negative Renal ROS     Musculoskeletal   Abdominal (+) + obese,   Peds  Hematology   Anesthesia Other Findings   Reproductive/Obstetrics (+) Pregnancy                             Anesthesia Physical Anesthesia Plan  ASA: II  Anesthesia Plan: Epidural   Post-op Pain Management:    Induction:   Airway Management Planned:   Additional Equipment:   Intra-op Plan:   Post-operative Plan:   Informed Consent: I have reviewed the patients History and Physical, chart, labs and discussed the procedure including the risks, benefits and alternatives for the proposed anesthesia with the patient or authorized representative who has indicated his/her understanding and acceptance.     Plan Discussed with:   Anesthesia Plan Comments:         Anesthesia Quick Evaluation

## 2016-09-01 NOTE — Anesthesia Procedure Notes (Addendum)
Epidural Patient location during procedure: OB Start time: 09/01/2016 7:50 AM End time: 09/01/2016 7:58 AM  Staffing Anesthesiologist: Leilani AbleHATCHETT, Ailsa Mireles Performed: anesthesiologist   Preanesthetic Checklist Completed: patient identified, surgical consent, pre-op evaluation, timeout performed, IV checked, risks and benefits discussed and monitors and equipment checked  Epidural Patient position: sitting Prep: site prepped and draped and DuraPrep Patient monitoring: continuous pulse ox and blood pressure Approach: midline Location: L3-L4 Injection technique: LOR air  Needle:  Needle type: Tuohy  Needle gauge: 17 G Needle length: 9 cm and 9 Needle insertion depth: 7 cm Catheter type: closed end flexible Catheter size: 19 Gauge Catheter at skin depth: 12 cm Test dose: negative and Other  Assessment Sensory level: T10 Events: blood not aspirated, injection not painful, no injection resistance, negative IV test and no paresthesia  Additional Notes Reason for block:procedure for pain

## 2016-09-02 LAB — CBC
HCT: 24.2 % — ABNORMAL LOW (ref 36.0–46.0)
Hemoglobin: 7.7 g/dL — ABNORMAL LOW (ref 12.0–15.0)
MCH: 22.8 pg — ABNORMAL LOW (ref 26.0–34.0)
MCHC: 31.8 g/dL (ref 30.0–36.0)
MCV: 71.6 fL — AB (ref 78.0–100.0)
PLATELETS: 241 10*3/uL (ref 150–400)
RBC: 3.38 MIL/uL — AB (ref 3.87–5.11)
RDW: 20.4 % — ABNORMAL HIGH (ref 11.5–15.5)
WBC: 18.3 10*3/uL — AB (ref 4.0–10.5)

## 2016-09-02 NOTE — Anesthesia Postprocedure Evaluation (Signed)
Anesthesia Post Note  Patient: Rebecca Salazar  Procedure(s) Performed: * No procedures listed *  Patient location during evaluation: Mother Baby Anesthesia Type: Epidural Level of consciousness: awake Pain management: satisfactory to patient Vital Signs Assessment: post-procedure vital signs reviewed and stable Respiratory status: spontaneous breathing Cardiovascular status: stable Anesthetic complications: no     Last Vitals:  Vitals:   09/01/16 2105 09/02/16 0630  BP: 136/77 118/74  Pulse: 93 84  Resp: 18 18  Temp: 37.9 C 36.9 C    Last Pain:  Vitals:   09/02/16 0630  TempSrc: Oral  PainSc:    Pain Goal: Patients Stated Pain Goal: 2 (09/01/16 1730)               Cephus ShellingBURGER,Krina Mraz

## 2016-09-02 NOTE — Lactation Note (Signed)
This note was copied from a baby's chart. Lactation Consultation Note: Lactation Brochure given to mother. Reviewed breastfeeding basic and reviewed Baby and Me Book. Mother was sleepy . [facility] when she feeds her baby next. Mother is breast and bottle feeding. Infant has had one 10 mins feeding. Mother state that she good most of the nipple and areola in. Mother seems eager to learn.  Patient Name: Rebecca Salazar Today's Date: 09/02/2016 Reason for consult: Initial assessment   Maternal Data    Feeding Feeding Type: Breast Fed  LATCH Score/Interventions Latch: Grasps breast easily, tongue down, lips flanged, rhythmical sucking.  Audible Swallowing: A few with stimulation  Type of Nipple: Everted at rest and after stimulation  Comfort (Breast/Nipple): Soft / non-tender     Hold (Positioning): No assistance needed to correctly position infant at breast.  LATCH Score: 9  Lactation Tools Discussed/Used     Consult Status Consult Status: Follow-up    Stevan BornKendrick, Remon Quinto [facility] 09/02/2016, 3:25 PM

## 2016-09-02 NOTE — Progress Notes (Signed)
Mother tolerates out of bed well. Denies dizziness or lightheadedness.

## 2016-09-02 NOTE — Progress Notes (Signed)
Subjective: Postpartum Day 1: Vaginal delivery, intact Patient up ad lib, reports no syncope or dizziness. Bottlefeeding/Breastfeeding Had fever 100.9 after delivery with no further fevers.  Contraceptive plan:  Pill  Objective: Vital signs in last 24 hours: Temp:  [98.4 F (36.9 C)-100.9 F (38.3 C)] 98.4 F (36.9 C) (12/10 0630) Pulse Rate:  [73-112] 84 (12/10 0630) Resp:  [18-20] 18 (12/10 0630) BP: (113-151)/(59-90) 118/74 (12/10 0630)  Physical Exam:  General: alert, cooperative and no distress Lochia: appropriate Uterine Fundus: firm Perineum: healing well, n/a DVT Evaluation: No evidence of DVT seen on physical exam. Negative Homan's sign.   CBC Latest Ref Rng & Units 09/02/2016 09/01/2016 08/09/2013  WBC 4.0 - 10.5 K/uL 18.3(H) 13.7(H) 14.1(H)  Hemoglobin 12.0 - 15.0 g/dL 7.7(L) 8.4(L) 12.7  Hematocrit 36.0 - 46.0 % 24.2(L) 27.5(L) 37.6  Platelets 150 - 400 K/uL 241 323 296     Assessment/Plan: Status post vaginal delivery day 1. Stable Continue current care. Plan for discharge tomorrow    Henderson Newcomerancy Jean ProtheroCNM 09/02/2016, 9:47 AM

## 2016-09-02 NOTE — Clinical Social Work Maternal (Signed)
  CLINICAL SOCIAL WORK MATERNAL/CHILD NOTE  Patient Details  Name: Rebecca Salazar MRN: [id number] Date of Birth: 1995/11/01  Date:  09/02/2016  Clinical Social Worker Initiating Note:  Ferdinand Lango Allye Hoyos, MSW, LCSW-A  Date/ Time Initiated:  09/02/16/1057     Child's Name:  Rebecca Salazar   Legal Guardian:  Other (Comment) (Not established by court system; [name] and FOB parent collectively )   Need for Interpreter:  None   Date of Referral:  09/01/16     Reason for Referral:  Current Substance Use/Substance Use During Pregnancy    Referral Source:  [facility]   Address:  [place] [address]  Phone number:  [phone]   Household Members:  Self   Natural Supports (not living in the home):  Immediate Family, Spouse/significant other, Extended Family, Parent   Professional Supports: None   Employment: Full-time   Type of Work: Agricultural consultant    Education:  Database administrator Resources:  Medicaid   Other Resources:  [facility]   Cultural/Religious Considerations Which May Impact Care:  none reported at this time.   Strengths:  Ability to meet basic needs , Pediatrician chosen , Compliance with medical plan , Home prepared for child  [facility] )   Risk Factors/Current Problems:  None   Cognitive State:  Alert , Goal Oriented , Able to Concentrate , Insightful    Mood/Affect:  Calm , Comfortable , Interested    CSW Assessment: CSW met with [name] at bedside to complete assessment. At this time [name] was resting in bed while baby and FOB laid on the couch. With [name]'s permission, this wrier explained role and reasoning for visit being due to [name]'s substance hx. This Probation officer explained hospitals policy and procedure regarding substance and mandatory report making for (+) UDS and/or cord blood test results. [name] verbalized understanding. [name] denied the need for substance resources at this time. This Probation officer assessed [name] and FOB  for additional psychosocial needs. At this time, no other needs were requested or identified. CSW will continue to follow results of pending UDS and cord blood test.   CSW Plan/Description:  Other (Comment), No Further Intervention Required/No Barriers to Discharge (CSW will contuine to follow UDS and cord blood test results )   [place], MSW, [facility]  Office: [phone]

## 2016-09-03 MED ORDER — IBUPROFEN 600 MG PO TABS: 600.0000 mg | ORAL_TABLET | Freq: Four times a day (QID) | ORAL | 1 refills | Status: DC | PRN

## 2016-09-03 NOTE — Discharge Instructions (Signed)
Postpartum Depression and Baby Blues The postpartum period begins right after the birth of a baby. During this time, there is often a great amount of joy and excitement. It is also a time of many changes in the life of the parents. Regardless of how many times a mother gives birth, each child brings new challenges and dynamics to the family. It is not unusual to have feelings of excitement along with confusing shifts in moods, emotions, and thoughts. All mothers are at risk of developing postpartum depression or the "baby blues." These mood changes can occur right after giving birth, or they may occur many months after giving birth. The baby blues or postpartum depression can be mild or severe. Additionally, postpartum depression can go away rather quickly, or it can be a long-term condition. What are the causes? Raised hormone levels and the rapid drop in those levels are thought to be a main cause of postpartum depression and the baby blues. A number of hormones change during and after pregnancy. Estrogen and progesterone usually decrease right after the delivery of your baby. The levels of thyroid hormone and various cortisol steroids also rapidly drop. Other factors that play a role in these mood changes include major life events and genetics. What increases the risk? If you have any of the following risks for the baby blues or postpartum depression, know what symptoms to watch out for during the postpartum period. Risk factors that may increase the likelihood of getting the baby blues or postpartum depression include:  Having a personal or family history of depression.  Having depression while being pregnant.  Having premenstrual mood issues or mood issues related to oral contraceptives.  Having a lot of life stress.  Having marital conflict.  Lacking a social support network.  Having a baby with special needs.  Having health problems, such as diabetes. What are the signs or  symptoms? Symptoms of baby blues include:  Brief changes in mood, such as going from extreme happiness to sadness.  Decreased concentration.  Difficulty sleeping.  Crying spells, tearfulness.  Irritability.  Anxiety. Symptoms of postpartum depression typically begin within the first month after giving birth. These symptoms include:  Difficulty sleeping or excessive sleepiness.  Marked weight loss.  Agitation.  Feelings of worthlessness.  Lack of interest in activity or food. Postpartum psychosis is a very serious condition and can be dangerous. Fortunately, it is rare. Displaying any of the following symptoms is cause for immediate medical attention. Symptoms of postpartum psychosis include:  Hallucinations and delusions.  Bizarre or disorganized behavior.  Confusion or disorientation. How is this diagnosed? A diagnosis is made by an evaluation of your symptoms. There are no medical or lab tests that lead to a diagnosis, but there are various questionnaires that a health care provider may use to identify those with the baby blues, postpartum depression, or psychosis. Often, a screening tool called the [place] Postnatal Depression Scale is used to diagnose depression in the postpartum period. How is this treated? The baby blues usually goes away on its own in 1-2 weeks. Social support is often all that is needed. You will be encouraged to get adequate sleep and rest. Occasionally, you may be given medicines to help you sleep. Postpartum depression requires treatment because it can last several months or longer if it is not treated. Treatment may include individual or group therapy, medicine, or both to address any social, physiological, and psychological factors that may play a role in the depression. Regular exercise, a  healthy diet, rest, and social support may also be strongly recommended. Postpartum psychosis is more serious and needs treatment right away. Hospitalization is  often needed. Follow these instructions at home:  Get as much rest as you can. Nap when the baby sleeps.  Exercise regularly. Some women find yoga and walking to be beneficial.  Eat a balanced and nourishing diet.  Do little things that you enjoy. Have a cup of tea, take a bubble bath, read your favorite magazine, or listen to your favorite music.  Avoid alcohol.  Ask for help with household chores, cooking, grocery shopping, or running errands as needed. Do not try to do everything.  Talk to people close to you about how you are feeling. Get support from your partner, family members, friends, or other new moms.  Try to stay positive in how you think. Think about the things you are grateful for.  Do not spend a lot of time alone.  Only take over-the-counter or prescription medicine as directed by your health care provider.  Keep all your postpartum appointments.  Let your health care provider know if you have any concerns. Contact a health care provider if: You are having a reaction to or problems with your medicine. Get help right away if:  You have suicidal feelings.  You think you may harm the baby or someone else. This information is not intended to replace advice given to you by your health care provider. Make sure you discuss any questions you have with your health care provider. Document Released: 06/14/2004 Document Revised: 02/16/2016 Document Reviewed: 06/22/2013 Elsevier Interactive Patient Education  2017 [place]. Iron-Rich Diet Introduction Iron is a mineral that helps your body to produce hemoglobin. Hemoglobin is a protein in your red blood cells that carries oxygen to your body's tissues. Eating too little iron may cause you to feel weak and tired, and it can increase your risk for infection. Eating enough iron is necessary for your body's metabolism, muscle function, and nervous system. Iron is naturally found in many foods. It can also be added to foods  or fortified in foods. There are two types of dietary iron:  Heme iron. Heme iron is absorbed by the body more easily than nonheme iron. Heme iron is found in meat, poultry, and fish.  Nonheme iron. Nonheme iron is found in dietary supplements, iron-fortified grains, beans, and vegetables. You may need to follow an iron-rich diet if:  You have been diagnosed with iron deficiency or iron-deficiency anemia.  You have a condition that prevents you from absorbing dietary iron, such as:  Infection in your intestines.  Celiac disease. This involves long-lasting (chronic) inflammation of your intestines.  You do not eat enough iron.  You eat a diet that is high in foods that impair iron absorption.  You have lost a lot of blood.  You have heavy bleeding during your menstrual cycle.  You are pregnant. What is my plan? Your health care provider may help you to determine how much iron you need per day based on your condition. Generally, when a person consumes sufficient amounts of iron in the diet, the following iron needs are met:  Men.  16-31 years old: 11 mg per day.  80-84 years old: 8 mg per day.  Women.  36-33 years old: 15 mg per day.  60-36 years old: 18 mg per day.  Over 80 years old: 8 mg per day.  Pregnant women: 27 mg per day.  Breastfeeding women: 9 mg per  day. What do I need to know about an iron-rich diet?  Eat fresh fruits and vegetables that are high in vitamin C along with foods that are high in iron. This will help increase the amount of iron that your body absorbs from food, especially with foods containing nonheme iron. Foods that are high in vitamin C include oranges, peppers, tomatoes, and mango.  Take iron supplements only as directed by your health care provider. Overdose of iron can be life-threatening. If you were prescribed iron supplements, take them with orange juice or a vitamin C supplement.  Cook foods in pots and pans that are made from  iron.  Eat nonheme iron-containing foods alongside foods that are high in heme iron. This helps to improve your iron absorption.  Certain foods and drinks contain compounds that impair iron absorption. Avoid eating these foods in the same meal as iron-rich foods or with iron supplements. These include:  Coffee, black tea, and red wine.  Milk, dairy products, and foods that are high in calcium.  Beans, soybeans, and peas.  Whole grains.  When eating foods that contain both nonheme iron and compounds that impair iron absorption, follow these tips to absorb iron better.  Soak beans overnight before cooking.  Soak whole grains overnight and drain them before using.  Ferment flours before baking, such as using yeast in bread dough. What foods can I eat? Grains  Iron-fortified breakfast cereal. Iron-fortified whole-wheat bread. Enriched rice. Sprouted grains. Vegetables  Spinach. Potatoes with skin. Green peas. Broccoli. Red and green bell peppers. Fermented vegetables. Fruits  Prunes. Raisins. Oranges. Strawberries. Mango. Grapefruit. Meats and Other Protein Sources  Beef liver. Oysters. Beef. Shrimp. [place]. Chicken. [place]. Sardines. Chickpeas. Nuts. Tofu. Beverages  Tomato juice. Fresh orange juice. Prune juice. Hibiscus tea. Fortified instant breakfast shakes. Condiments  Tahini. Fermented soy sauce. Sweets and Desserts  Black-strap molasses. Other  Wheat germ. The items listed above may not be a complete list of recommended foods or beverages. Contact your dietitian for more options.  What foods are not recommended? Grains  Whole grains. Bran cereal. Bran flour. Oats. Vegetables  Artichokes. Brussels sprouts. Kale. Fruits  Blueberries. Raspberries. Strawberries. Figs. Meats and Other Protein Sources  Soybeans. Products made from soy protein. Dairy  Milk. Cream. Cheese. Yogurt. Cottage cheese. Beverages  Coffee. Black tea. Red wine. Sweets and Desserts  Cocoa.  Chocolate. Ice cream. Other  Basil. Oregano. Parsley. The items listed above may not be a complete list of foods and beverages to avoid. Contact your dietitian for more information.  This information is not intended to replace advice given to you by your health care provider. Make sure you discuss any questions you have with your health care provider. Document Released: 04/24/2005 Document Revised: 03/30/2016 Document Reviewed: 04/07/2014  2017 Elsevier

## 2016-09-03 NOTE — Discharge Summary (Signed)
OB Discharge Summary     Patient Name: Rebecca HoardGenesis Goodwine-Trinidad DOB: May 10, 1996 MRN: [id number]  Date of admission: 08/31/2016 Delivering MD: Kenney HousemanPROTHERO, NANCY JEAN   Date of discharge: 09/03/2016  Admitting diagnosis: 41w direct admit, induction Intrauterine pregnancy: 5970w1d     Secondary diagnosis:  Active Problems:   Indication for care or intervention in labor or delivery   Anemia   SVD (spontaneous vaginal delivery)  Additional problems: None     Discharge diagnosis: Term Pregnancy Delivered and Anemia                                                                                                Post partum procedures:None  Augmentation: None-IOL  Complications: None  Hospital course:  Induction of Labor With Vaginal Delivery   [age] y.o. yo G1P1001 at 1970w1d was admitted to the hospital 08/31/2016 for induction of labor.  Indication for induction: Postdates.  Patient had an uncomplicated labor course as follows: Membrane Rupture Time/Date: 4:17 AM ,09/01/2016   Intrapartum Procedures: Episiotomy: None [1]                                         Lacerations:  None [1]  Patient had delivery of a Viable infant.  Information for the patient's newborn:  Leia AlfGoodwine-Trinidad, Girl Joany [[id number]][[id number]]  Delivery Method: Vaginal, Spontaneous Delivery (Filed from Delivery Summary)   09/01/2016  Details of delivery can be found in separate delivery note.  Patient had a routine postpartum course. Patient is discharged home 09/03/16.   Physical exam Vitals:   09/01/16 2105 09/02/16 0630 09/02/16 2031 09/03/16 0546  BP: 136/77 118/74 124/63 124/81  Pulse: 93 84 75 74  Resp: 18 18 18 18   Temp: 100.2 F (37.9 C) 98.4 F (36.9 C) 98.3 F (36.8 C) 98 F (36.7 C)  TempSrc: Oral Oral Oral Oral  SpO2:      Weight:      Height:       General: alert, cooperative and no distress Lochia: appropriate Uterine Fundus: firm Incision: N/A DVT Evaluation: No cords or calf tenderness. No  significant calf/ankle edema. Labs: Lab Results  Component Value Date   WBC 18.3 (H) 09/02/2016   HGB 7.7 (L) 09/02/2016   HCT 24.2 (L) 09/02/2016   MCV 71.6 (L) 09/02/2016   PLT 241 09/02/2016   Discussed s/s of anemia. Instructed to call if symptoms noted and not manageable, for potential blood transfusion.   No flowsheet data found.  Discharge instruction: per After Visit Summary and "Baby and Me Booklet". Pain Management, Peri-Care, Breastfeeding, Who and When to call for postpartum complications. Information Sheet(s) given Iron rich diet, contraception choices, ppd and bb   After visit meds:    Medication List    STOP taking these medications   erythromycin ophthalmic ointment     TAKE these medications   ibuprofen 600 MG tablet Commonly known as:  ADVIL,MOTRIN Take 1 tablet (600 mg total) by mouth every 6 (six) hours as needed.  Diet: routine diet  Activity: Advance as tolerated. Pelvic rest for 6 weeks.   Outpatient follow up:6 weeks Follow up Appt:No future appointments. Follow up Visit:No Follow-up on file.  Postpartum contraception: Undecided  Newborn Data: Live born female  Birth Weight: 7 lb 12.3 oz (3525 g) APGAR: 8, 9  Baby Feeding: Breast Disposition:home with mother   09/03/2016 Cherre RobinsJessica L Malin Sambrano, CNM

## 2016-09-03 NOTE — Lactation Note (Signed)
This note was copied from a baby's chart. Lactation Consultation Note  Assisted mom with positioning. Breast compression taught. Mom denies pain. Encouraged mom to soften her breasts if they do not soften with breast feeding. Engorgement prevention and relief reviewed as was hand expression. [facility] Support groups encouraged. Patient Name: Rebecca Janace HoardGenesis Salazar ZOXWR'UToday's Date: 09/03/2016 Reason for consult: Follow-up assessment   Maternal Data Has patient been taught Hand Expression?: Yes  Feeding Feeding Type: Breast Fed Length of feed: 10 min  LATCH Score/Interventions Latch: Repeated attempts needed to sustain latch, nipple held in mouth throughout feeding, stimulation needed to elicit sucking reflex.  Audible Swallowing: A few with stimulation  Type of Nipple: Everted at rest and after stimulation  Comfort (Breast/Nipple): Soft / non-tender     Hold (Positioning): Assistance needed to correctly position infant at breast and maintain latch.  LATCH Score: 7  Lactation Tools Discussed/Used     Consult Status      Soyla DryerJoseph, Reeva Davern 09/03/2016, 10:53 AM

## 2016-11-20 ENCOUNTER — Encounter (HOSPITAL_COMMUNITY): Payer: Self-pay | Admitting: Emergency Medicine

## 2016-11-20 ENCOUNTER — Emergency Department (HOSPITAL_COMMUNITY)
Admission: EM | Admit: 2016-11-20 | Discharge: 2016-11-20 | Disposition: A | Discharge status: Home / Self Care | Payer: 59 | Attending: Emergency Medicine | Admitting: Emergency Medicine

## 2016-11-20 DIAGNOSIS — L03011 Cellulitis of right finger: Secondary | ICD-10-CM | POA: Insufficient documentation

## 2016-11-20 DIAGNOSIS — Z87891 Personal history of nicotine dependence: Secondary | ICD-10-CM | POA: Diagnosis not present

## 2016-11-20 MED ORDER — LIDOCAINE HCL (PF) 1 % IJ SOLN
5.0000 mL | Freq: Once | INTRAMUSCULAR | Status: AC
  Administered 2016-11-20: 5 mL
  Filled 2016-11-20: qty 5

## 2016-11-20 NOTE — ED Notes (Signed)
EDP at bedside  

## 2016-11-20 NOTE — Discharge Instructions (Signed)
Use warm soaks frequently throughout the day. Raise, elevate hand Today. Keep it clean and dry. Follow-up with your primary care provider or urgent care in 2 days for wound recheck. Return if you have significant increase redness surrounding the incision with fevers and chills. No antibiotics necessary at this time.  Contact a health care provider if: Your symptoms get worse or do not improve with treatment. You have a fever or chills. You have redness spreading from the affected area. You have continued or increased fluid, blood, or pus coming from the affected area. Your finger or knuckle becomes swollen or is difficult to move.

## 2016-11-20 NOTE — ED Provider Notes (Signed)
[facility]-EMERGENCY DEPT Provider Note   CSN: [id number] Arrival date & time: 11/20/16  0810     History   Chief Complaint Chief Complaint  Patient presents with  . Finger Injury    HPI Rebecca Salazar is a 21 y.o. female presents today with complaints of finger redness and swelling for 1 week. She states her symptoms have been worsening. She reports associated pain an discomfort in finger. She states it happened after she removed a hangnail. She denies fever, chills, nausea, vomiting, diarrhea. She reports trying "poking it myself burt it didn't work". She states nothing makes her symptoms better or worse.   The history is provided by the patient. No language interpreter was used.    History reviewed. No pertinent past medical history.  Patient Active Problem List   Diagnosis Date Noted  . Anemia 09/01/2016  . SVD (spontaneous vaginal delivery) 09/01/2016  . Indication for care or intervention in labor or delivery 08/31/2016    History reviewed. No pertinent surgical history.  OB History    Gravida Para Term Preterm AB Living   1 1 1  0 0 1   SAB TAB Ectopic Multiple Live Births   0 0 0 0 1       Home Medications    Prior to Admission medications   Medication Sig Start Date End Date Taking? Authorizing Provider  ibuprofen (ADVIL,MOTRIN) 600 MG tablet Take 1 tablet (600 mg total) by mouth every 6 (six) hours as needed. 09/03/16   Gerrit HeckJessica Emly, CNM    Family History Family History  Problem Relation Age of Onset  . Hypertension Father     Social History Social History  Substance Use Topics  . Smoking status: Former Smoker    Types: Cigarettes    Quit date: 12/31/2015  . Smokeless tobacco: Never Used  . Alcohol use No     Allergies   Patient has no known allergies.   Review of Systems Review of Systems  Constitutional: Negative for chills and fever.  Musculoskeletal: Positive for arthralgias.     Physical Exam Updated Vital Signs BP 123/79  (BP Location: Right Arm)   Pulse 67   Temp 98.4 F (36.9 C) (Oral)   Resp 16   LMP 11/14/2016 (Exact Date)   SpO2 100%   Physical Exam  Constitutional: She appears well-developed and well-nourished.  Well appearing  HENT:  Head: Normocephalic and atraumatic.  Nose: Nose normal.  Eyes: Conjunctivae and EOM are normal.  Neck: Normal range of motion.  Cardiovascular: Normal rate and intact distal pulses.   Distal pulses intact.   Pulmonary/Chest: Effort normal. No respiratory distress.  Normal work of breathing. No respiratory distress noted.   Abdominal: Soft.  Musculoskeletal: Normal range of motion.  Right hand 3rd finger distal phalangeal radial aspect of nail bed with mild swelling, redness, and tender to palpation. No surrounding erythema. Full ROM of affected finger.   Neurological: She is alert.  Sensory intact to affected finger. Muscle strength 5/5 against resistance to flexion and extension of affected finger.   Skin: Skin is warm.  Psychiatric: She has a normal mood and affect. Her behavior is normal.  Nursing note and vitals reviewed.    ED Treatments / Results  Labs (all labs ordered are listed, but only abnormal results are displayed) Labs Reviewed - No data to display  EKG  EKG Interpretation None       Radiology No results found.  Procedures .Marland Kitchen.Incision and Drainage Date/Time: 11/20/2016 10:46 AM Performed by:  Monta Police MANUEL Authorized by: Alvina Chou   Consent:    Consent obtained:  Verbal   Consent given by:  Patient   Risks discussed:  Bleeding, incomplete drainage, infection and pain Location:    Type:  Abscess   Size:  1cm   Location:  Upper extremity   Upper extremity location:  Finger   Finger location:  R long finger Pre-procedure details:    Skin preparation:  Betadine Anesthesia (see MAR for exact dosages):    Anesthesia method:  Nerve block   Block needle gauge:  25 G   Block anesthetic:  Lidocaine 1%  w/o epi   Block technique:  Digital block   Block injection procedure:  Anatomic landmarks identified, anatomic landmarks palpated, introduced needle, negative aspiration for blood and incremental injection   Block outcome:  Anesthesia achieved Procedure type:    Complexity:  Simple Procedure details:    Needle aspiration: no     Incision types:  Stab incision   Incision depth:  Dermal   Scalpel blade:  11   Wound management:  Irrigated with saline   Drainage:  Bloody and purulent   Drainage amount: Moderate blood with scant purulence.   Wound treatment:  Wound left open   Packing materials:  None Post-procedure details:    Patient tolerance of procedure:  Tolerated well, no immediate complications   (including critical care time)  Medications Ordered in ED Medications  lidocaine (PF) (XYLOCAINE) 1 % injection 5 mL (5 mLs Infiltration Given 11/20/16 0906)     Initial Impression / Assessment and Plan / ED Course  I have reviewed the triage vital signs and the nursing notes.  Pertinent labs & imaging results that were available during my care of the patient were reviewed by me and considered in my medical decision making (see chart for details).    Patient with skin abscess on right 3rd finger amenable to incision and drainage.  Abscess was not large enough to warrant packing or drain,  wound recheck in 2 days. Encouraged home warm soaks and flushing.  No obvious signs of cellulitis. Will d/c to home.  No antibiotic therapy is indicated. Reasons to immediately return to the emergency department discussed   Final Clinical Impressions(s) / ED Diagnoses   Final diagnoses:  Paronychia of finger of right hand    New Prescriptions New Prescriptions   No medications on file     [address] [place], [place] 11/20/16 1052    Geoffery Lyons, MD 11/20/16 1352

## 2016-11-20 NOTE — ED Triage Notes (Signed)
Pt from home with c/o right middle finger redness, swelling, and pain starting last week after removing a hangnail.  NAD, A&O.

## 2017-05-19 ENCOUNTER — Encounter (HOSPITAL_COMMUNITY): Payer: Self-pay

## 2017-05-19 ENCOUNTER — Emergency Department (HOSPITAL_COMMUNITY)
Admission: EM | Admit: 2017-05-19 | Discharge: 2017-05-19 | Disposition: A | Discharge status: Home / Self Care | Payer: 59 | Attending: Emergency Medicine | Admitting: Emergency Medicine

## 2017-05-19 DIAGNOSIS — Y939 Activity, unspecified: Secondary | ICD-10-CM | POA: Insufficient documentation

## 2017-05-19 DIAGNOSIS — Z87891 Personal history of nicotine dependence: Secondary | ICD-10-CM | POA: Insufficient documentation

## 2017-05-19 DIAGNOSIS — S39012A Strain of muscle, fascia and tendon of lower back, initial encounter: Secondary | ICD-10-CM

## 2017-05-19 DIAGNOSIS — Y998 Other external cause status: Secondary | ICD-10-CM | POA: Insufficient documentation

## 2017-05-19 DIAGNOSIS — Y92481 Parking lot as the place of occurrence of the external cause: Secondary | ICD-10-CM | POA: Diagnosis not present

## 2017-05-19 DIAGNOSIS — S3992XA Unspecified injury of lower back, initial encounter: Secondary | ICD-10-CM | POA: Diagnosis present

## 2017-05-19 DIAGNOSIS — S20219A Contusion of unspecified front wall of thorax, initial encounter: Secondary | ICD-10-CM | POA: Diagnosis not present

## 2017-05-19 MED ORDER — CYCLOBENZAPRINE HCL 10 MG PO TABS: 10.0000 mg | ORAL_TABLET | Freq: Two times a day (BID) | ORAL | 0 refills | Status: DC | PRN

## 2017-05-19 MED ORDER — IBUPROFEN 600 MG PO TABS: 600.0000 mg | ORAL_TABLET | Freq: Four times a day (QID) | ORAL | 0 refills | Status: DC | PRN

## 2017-05-19 MED ORDER — LIDOCAINE HCL (PF) 1 % IJ SOLN
5.0000 mL | Freq: Once | INTRAMUSCULAR | Status: DC
  Filled 2017-05-19: qty 5
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
## 2017-05-19 NOTE — ED Notes (Signed)
Small lac to chin, bleeding controlled.
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
## 2017-06-02 ENCOUNTER — Encounter (HOSPITAL_COMMUNITY): Payer: Self-pay

## 2017-06-02 ENCOUNTER — Emergency Department (HOSPITAL_COMMUNITY): Payer: 59

## 2017-06-02 ENCOUNTER — Emergency Department (HOSPITAL_COMMUNITY)
Admission: EM | Admit: 2017-06-02 | Discharge: 2017-06-02 | Disposition: A | Discharge status: Home / Self Care | Payer: 59 | Attending: Emergency Medicine | Admitting: Emergency Medicine

## 2017-06-02 DIAGNOSIS — M545 Low back pain, unspecified: Secondary | ICD-10-CM

## 2017-06-02 DIAGNOSIS — Y92410 Unspecified street and highway as the place of occurrence of the external cause: Secondary | ICD-10-CM | POA: Diagnosis not present

## 2017-06-02 DIAGNOSIS — Y999 Unspecified external cause status: Secondary | ICD-10-CM | POA: Insufficient documentation

## 2017-06-02 DIAGNOSIS — Y9389 Activity, other specified: Secondary | ICD-10-CM | POA: Diagnosis not present

## 2017-06-02 DIAGNOSIS — R0789 Other chest pain: Secondary | ICD-10-CM | POA: Diagnosis not present

## 2017-06-02 DIAGNOSIS — Z87891 Personal history of nicotine dependence: Secondary | ICD-10-CM | POA: Insufficient documentation

## 2017-06-02 DIAGNOSIS — S299XXA Unspecified injury of thorax, initial encounter: Secondary | ICD-10-CM | POA: Diagnosis not present

## 2017-06-02 DIAGNOSIS — R079 Chest pain, unspecified: Secondary | ICD-10-CM | POA: Diagnosis present

## 2017-06-02 LAB — PREGNANCY, URINE: Preg Test, Ur: NEGATIVE

## 2017-06-02 NOTE — Discharge Instructions (Signed)
Alternate 600 mg of ibuprofen and [id number] mg of Tylenol every 3 hours as needed for pain. Do not exceed 4000 mg of Tylenol daily.  Apply ice or heat to the affected areas for comfort. Do some gentle stretching to avoid muscle stiffness. Follow up with primary care physician for reevaluation if symptoms persist. Return to the ED immediately for any concerning signs or symptoms develop.

## 2017-06-02 NOTE — ED Notes (Signed)
Declined W/C at D/C and was escorted to lobby by RN. 
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
## 2017-08-19 ENCOUNTER — Other Ambulatory Visit: Payer: Self-pay

## 2017-08-19 ENCOUNTER — Emergency Department (HOSPITAL_COMMUNITY)
Admission: EM | Admit: 2017-08-19 | Discharge: 2017-08-19 | Disposition: A | Discharge status: Home / Self Care | Payer: 59 | Attending: Emergency Medicine | Admitting: Emergency Medicine

## 2017-08-19 ENCOUNTER — Encounter (HOSPITAL_COMMUNITY): Payer: Self-pay

## 2017-08-19 ENCOUNTER — Emergency Department (HOSPITAL_COMMUNITY): Payer: 59

## 2017-08-19 DIAGNOSIS — S39012A Strain of muscle, fascia and tendon of lower back, initial encounter: Secondary | ICD-10-CM

## 2017-08-19 DIAGNOSIS — F17210 Nicotine dependence, cigarettes, uncomplicated: Secondary | ICD-10-CM | POA: Diagnosis not present

## 2017-08-19 DIAGNOSIS — S3992XA Unspecified injury of lower back, initial encounter: Secondary | ICD-10-CM | POA: Diagnosis not present

## 2017-08-19 DIAGNOSIS — Y939 Activity, unspecified: Secondary | ICD-10-CM | POA: Insufficient documentation

## 2017-08-19 DIAGNOSIS — M549 Dorsalgia, unspecified: Secondary | ICD-10-CM | POA: Diagnosis not present

## 2017-08-19 DIAGNOSIS — M542 Cervicalgia: Secondary | ICD-10-CM | POA: Diagnosis not present

## 2017-08-19 DIAGNOSIS — Y999 Unspecified external cause status: Secondary | ICD-10-CM | POA: Diagnosis not present

## 2017-08-19 DIAGNOSIS — Y92410 Unspecified street and highway as the place of occurrence of the external cause: Secondary | ICD-10-CM | POA: Diagnosis not present

## 2017-08-19 LAB — I-STAT BETA HCG BLOOD, ED (MC, WL, AP ONLY): I-stat hCG, quantitative: <5 m[IU]/mL (ref <5)

## 2017-08-19 MED ORDER — KETOROLAC TROMETHAMINE 15 MG/ML IJ SOLN
15.0000 mg | Freq: Once | INTRAMUSCULAR | Status: AC
  Administered 2017-08-19: 15 mg via INTRAMUSCULAR
  Filled 2017-08-19: qty 1
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
## 2017-08-19 NOTE — ED Notes (Signed)
PT states understanding of care given, follow up care. PT ambulated from ED to car with a steady gait.  

## 2017-08-19 NOTE — Discharge Instructions (Signed)
Please read the attached information regarding your condition. Continue your home medications of muscle relaxer and anti-inflammatories as needed. Apply heating pad to affected areas to help with comfort. [facility]. Return to ED for worsening pain, chest pain, loss of bladder function, numbness in legs, additional injuries or accidents.

## 2017-08-19 NOTE — ED Notes (Signed)
Patient transported to X-ray 

## 2018-03-31 ENCOUNTER — Other Ambulatory Visit: Payer: Self-pay

## 2018-03-31 ENCOUNTER — Encounter (HOSPITAL_COMMUNITY): Payer: Self-pay

## 2018-03-31 DIAGNOSIS — Y92410 Unspecified street and highway as the place of occurrence of the external cause: Secondary | ICD-10-CM | POA: Insufficient documentation

## 2018-03-31 DIAGNOSIS — Y9389 Activity, other specified: Secondary | ICD-10-CM | POA: Diagnosis not present

## 2018-03-31 DIAGNOSIS — S3992XA Unspecified injury of lower back, initial encounter: Secondary | ICD-10-CM | POA: Diagnosis present

## 2018-03-31 DIAGNOSIS — Y999 Unspecified external cause status: Secondary | ICD-10-CM | POA: Insufficient documentation

## 2018-03-31 DIAGNOSIS — F17210 Nicotine dependence, cigarettes, uncomplicated: Secondary | ICD-10-CM | POA: Diagnosis not present

## 2018-03-31 DIAGNOSIS — S39012A Strain of muscle, fascia and tendon of lower back, initial encounter: Secondary | ICD-10-CM | POA: Insufficient documentation
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
## 2018-04-01 ENCOUNTER — Emergency Department (HOSPITAL_COMMUNITY)
Admission: EM | Admit: 2018-04-01 | Discharge: 2018-04-01 | Disposition: A | Discharge status: Home / Self Care | Payer: 59 | Attending: Emergency Medicine | Admitting: Emergency Medicine

## 2018-04-01 DIAGNOSIS — S39012A Strain of muscle, fascia and tendon of lower back, initial encounter: Secondary | ICD-10-CM

## 2018-04-01 MED ORDER — IBUPROFEN 800 MG PO TABS: 800.0000 mg | ORAL_TABLET | Freq: Three times a day (TID) | ORAL | 0 refills | Status: DC

## 2018-04-01 MED ORDER — METHOCARBAMOL 500 MG PO TABS: 500.0000 mg | ORAL_TABLET | Freq: Two times a day (BID) | ORAL | 0 refills | Status: DC

## 2018-04-01 MED ORDER — IBUPROFEN 800 MG PO TABS
800.0000 mg | ORAL_TABLET | Freq: Once | ORAL | Status: AC
  Administered 2018-04-01: 800 mg via ORAL
  Filled 2018-04-01: qty 1
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
## 2018-08-18 ENCOUNTER — Emergency Department (HOSPITAL_COMMUNITY)
Admission: EM | Admit: 2018-08-18 | Discharge: 2018-08-18 | Disposition: A | Discharge status: Home / Self Care | Payer: 59 | Attending: Emergency Medicine | Admitting: Emergency Medicine

## 2018-08-18 ENCOUNTER — Encounter (HOSPITAL_COMMUNITY): Payer: Self-pay | Admitting: Emergency Medicine

## 2018-08-18 ENCOUNTER — Emergency Department (HOSPITAL_COMMUNITY): Payer: 59

## 2018-08-18 DIAGNOSIS — F17210 Nicotine dependence, cigarettes, uncomplicated: Secondary | ICD-10-CM | POA: Diagnosis not present

## 2018-08-18 DIAGNOSIS — W2203XA Walked into furniture, initial encounter: Secondary | ICD-10-CM | POA: Diagnosis not present

## 2018-08-18 DIAGNOSIS — S92424A Nondisplaced fracture of distal phalanx of right great toe, initial encounter for closed fracture: Secondary | ICD-10-CM | POA: Diagnosis not present

## 2018-08-18 DIAGNOSIS — S99921A Unspecified injury of right foot, initial encounter: Secondary | ICD-10-CM | POA: Diagnosis present

## 2018-08-18 DIAGNOSIS — N3000 Acute cystitis without hematuria: Secondary | ICD-10-CM | POA: Insufficient documentation

## 2018-08-18 DIAGNOSIS — Y939 Activity, unspecified: Secondary | ICD-10-CM | POA: Insufficient documentation

## 2018-08-18 DIAGNOSIS — Y999 Unspecified external cause status: Secondary | ICD-10-CM | POA: Insufficient documentation

## 2018-08-18 DIAGNOSIS — Y929 Unspecified place or not applicable: Secondary | ICD-10-CM | POA: Diagnosis not present

## 2018-08-18 LAB — URINALYSIS, ROUTINE W REFLEX MICROSCOPIC
Bilirubin Urine: NEGATIVE
Glucose, UA: NEGATIVE mg/dL
Hgb urine dipstick: NEGATIVE
Ketones, ur: NEGATIVE mg/dL
Nitrite: NEGATIVE
Protein, ur: NEGATIVE mg/dL
SPECIFIC GRAVITY, URINE: 1.026 (ref 1.005–1.030)
pH: 6 (ref 5.0–8.0)

## 2018-08-18 LAB — POC URINE PREG, ED: PREG TEST UR: NEGATIVE

## 2018-08-18 MED ORDER — NAPROXEN SODIUM 550 MG PO TABS: 550.0000 mg | ORAL_TABLET | Freq: Two times a day (BID) | ORAL | 0 refills | Status: DC

## 2018-08-18 MED ORDER — SULFAMETHOXAZOLE-TRIMETHOPRIM 800-160 MG PO TABS: 1.0000 | ORAL_TABLET | Freq: Two times a day (BID) | ORAL | 0 refills | Status: AC

## 2018-08-18 NOTE — ED Triage Notes (Signed)
Pt states she hurt her right foot two weeks ago- (possibly kicking a bed?) unsure exactly how she hurt it. Pt states the pain has gotten worse. Pain is in right foot/right toe.

## 2018-08-18 NOTE — ED Notes (Signed)
Patient transported to X-ray 

## 2018-08-18 NOTE — ED Notes (Signed)
Pt verbalized understanding of discharge instructions and denies any further questions at this time.   

## 2018-08-18 NOTE — Discharge Instructions (Addendum)
Follow up with the orthopedic doctor for your toe fracture and with your primary care doctor for your Urinary tact infection. Return here as needed.

## 2018-08-18 NOTE — ED Provider Notes (Signed)
[facility] Provider [facility]   CSN: [id number] Arrival date & time: 08/18/18  1509     History   Chief Complaint Chief Complaint  Patient presents with  . Foot Pain    HPI Rebecca Salazar is a [age] y.o. female who presents to the ED with right great toe pain after hitting her toe on a metal bed frame 2 weeks ago. Patient reports increased pain. Patient also request check for possible UTI.   HPI  History reviewed. No pertinent past medical history.  Patient Active Problem List   Diagnosis Date Noted  . Anemia 09/01/2016  . SVD (spontaneous vaginal delivery) 09/01/2016  . Indication for care or intervention in labor or delivery 08/31/2016    History reviewed. No pertinent surgical history.   OB History    Gravida  1   Para  1   Term  1   Preterm  0   AB  0   Living  1     SAB  0   TAB  0   Ectopic  0   Multiple  0   Live Births  1            Home Medications    Prior to Admission medications   Medication Sig Start Date End Date Taking? Authorizing Provider  methocarbamol (ROBAXIN) 500 MG tablet Take 1 tablet (500 mg total) by mouth 2 (two) times daily. 04/01/18   McDonald, Mia A, PA-C  naproxen sodium (ANAPROX DS) 550 MG tablet Take 1 tablet (550 mg total) by mouth 2 (two) times daily with a meal. 08/18/18   , [place] M, NP  sulfamethoxazole-trimethoprim (BACTRIM DS,SEPTRA DS) 800-160 MG tablet Take 1 tablet by mouth 2 (two) times daily for 7 days. 08/18/18 08/25/18  Janne Napoleon,  M, NP    Family History Family History  Problem Relation Age of Onset  . Hypertension Father     Social History Social History   Tobacco Use  . Smoking status: Current Every Day Smoker    Packs/day: 0.50    Types: Cigarettes  . Smokeless tobacco: Never Used  Substance Use Topics  . Alcohol use: Yes  . Drug use: Yes    Types: Marijuana     Allergies   Patient has no known allergies.   Review of  Systems Review of Systems  Constitutional: Negative for chills and fever.  HENT: Negative.   Gastrointestinal: Negative for abdominal pain and vomiting.  Genitourinary: Positive for frequency. Negative for dysuria, urgency and vaginal discharge.  Musculoskeletal: Positive for joint swelling.  Skin: Positive for color change.  Psychiatric/Behavioral: Negative for confusion.     Physical Exam Updated Vital Signs BP (!) 134/93 (BP Location: Right Arm)   Pulse (!) 57   Temp 98.6 F (37 C) (Oral)   Resp 16   Ht 5\' 9"  (1.753 m)   Wt 81.6 kg   LMP 07/07/2018   SpO2 100%   BMI 26.58 kg/m   Physical Exam  Constitutional: She appears well-developed and well-nourished. No distress.  HENT:  Head: Normocephalic.  Eyes: Conjunctivae are normal.  Neck: Neck supple.  Cardiovascular: Normal rate.  Pulmonary/Chest: Effort normal.  Abdominal: Soft. There is no tenderness.  Genitourinary:  Genitourinary Comments: Patient declined pelvic exam.  Musculoskeletal:       Right foot: There is tenderness and swelling. There is normal capillary refill and no laceration.  Right great toe with swelling and ecchymosis. Skin intact.  Neurological: She is alert.  Skin: Skin is warm and dry.  Psychiatric: She has a normal mood and affect. Her behavior is normal.  Nursing note and vitals reviewed.    ED Treatments / Results  Labs (all labs ordered are listed, but only abnormal results are displayed) Labs Reviewed  URINALYSIS, ROUTINE W REFLEX MICROSCOPIC - Abnormal; Notable for the following components:      Result Value   APPearance HAZY (*)    Leukocytes, UA LARGE (*)    Bacteria, UA RARE (*)    All other components within normal limits  URINE CULTURE  POC URINE PREG, ED   Radiology Dg Toe Great Right  Result Date: 08/18/2018 CLINICAL DATA:  Right great toe pain after hitting the great toe against a metal bed frame. EXAM: RIGHT GREAT TOE COMPARISON:  None. FINDINGS: Subtle nondisplaced  oblique fracture of the distal first proximal phalanx. No joint dislocation nor intra-articular extension of fracture. Mild soft tissue swelling is seen of the great toe. IMPRESSION: Nondisplaced oblique fracture of the distal first proximal phalanx. Electronically Signed   By: Tollie Eth M.D.   On: 08/18/2018 16:58    Procedures Procedures (including critical care time)  Medications Ordered in ED Medications - No data to display   Initial Impression / Assessment and Plan / ED Course  I have reviewed the triage vital signs and the nursing notes. Pt has been diagnosed with a UTI. Pt is afebrile, no CVA tenderness, normotensive, and denies N/V. Pt to be dc home with antibiotics and instructions to follow up with PCP if symptoms persist.urine sent for culture. Patient also with fx of right great toe, buddy tape, post op shoe and f/u with ortho.   Final Clinical Impressions(s) / ED Diagnoses   Final diagnoses:  Closed nondisplaced fracture of distal phalanx of right great toe, initial encounter  Acute cystitis without hematuria    ED Discharge Orders         Ordered    sulfamethoxazole-trimethoprim (BACTRIM DS,SEPTRA DS) 800-160 MG tablet  2 times daily     08/18/18 1707    naproxen sodium (ANAPROX DS) 550 MG tablet  2 times daily with meals     08/18/18 1707           Damian Leavell Maybell, NP 08/18/18 1610    Doug Sou, MD 08/19/18 2103

## 2018-08-19 LAB — URINE CULTURE

## 2018-11-04 ENCOUNTER — Encounter (HOSPITAL_COMMUNITY): Payer: Self-pay

## 2018-11-04 ENCOUNTER — Emergency Department (HOSPITAL_COMMUNITY)
Admission: EM | Admit: 2018-11-04 | Discharge: 2018-11-04 | Disposition: A | Discharge status: Home / Self Care | Payer: 59 | Attending: Emergency Medicine | Admitting: Emergency Medicine

## 2018-11-04 DIAGNOSIS — F17210 Nicotine dependence, cigarettes, uncomplicated: Secondary | ICD-10-CM | POA: Insufficient documentation

## 2018-11-04 DIAGNOSIS — R300 Dysuria: Secondary | ICD-10-CM | POA: Diagnosis present

## 2018-11-04 DIAGNOSIS — N3000 Acute cystitis without hematuria: Secondary | ICD-10-CM | POA: Diagnosis not present

## 2018-11-04 LAB — URINALYSIS, ROUTINE W REFLEX MICROSCOPIC
Bilirubin Urine: NEGATIVE
GLUCOSE, UA: NEGATIVE mg/dL
HGB URINE DIPSTICK: NEGATIVE
Ketones, ur: NEGATIVE mg/dL
NITRITE: POSITIVE — AB
Protein, ur: NEGATIVE mg/dL
SPECIFIC GRAVITY, URINE: 1.019 (ref 1.005–1.030)
pH: 7 (ref 5.0–8.0)

## 2018-11-04 LAB — PREGNANCY, URINE: Preg Test, Ur: NEGATIVE

## 2018-11-04 MED ORDER — CEPHALEXIN 500 MG PO CAPS: 500.0000 mg | ORAL_CAPSULE | Freq: Three times a day (TID) | ORAL | 0 refills | Status: AC

## 2018-11-04 NOTE — ED Notes (Signed)
Patient verbalizes understanding of discharge instructions. Opportunity for questioning and answers were provided. Armband removed by staff, pt discharged from ED.  

## 2018-11-04 NOTE — ED Provider Notes (Signed)
[facility] EMERGENCY DEPARTMENT Provider Note   CSN: [id number] Arrival date & time: 11/04/18  1049     History   Chief Complaint Chief Complaint  Patient presents with  . Dysuria    HPI Rebecca Salazar is a 23 y.o. female presenting to the emergency department with 1 week of dysuria.  Patient states she has burning sensation with urination with associated increased urinary frequency and malodorous urine.  She has been drinking cranberry juice without relief.  She states this feels similar to previous UTI.  Denies abdominal pain, nausea, vomiting, fever, flank pain, vaginal bleeding or discharge.  She is currently sexually active with men, without protection.  LMP 10/13/2018.  The history is provided by the patient.    History reviewed. No pertinent past medical history.  Patient Active Problem List   Diagnosis Date Noted  . Anemia 09/01/2016  . SVD (spontaneous vaginal delivery) 09/01/2016  . Indication for care or intervention in labor or delivery 08/31/2016    History reviewed. No pertinent surgical history.   OB History    Gravida  1   Para  1   Term  1   Preterm  0   AB  0   Living  1     SAB  0   TAB  0   Ectopic  0   Multiple  0   Live Births  1            Home Medications    Prior to Admission medications   Medication Sig Start Date End Date Taking? Authorizing Provider  cephALEXin (KEFLEX) 500 MG capsule Take 1 capsule (500 mg total) by mouth 3 (three) times daily for 5 days. 11/04/18 11/09/18  Jany Buckwalter, [place] N, PA-C  methocarbamol (ROBAXIN) 500 MG tablet Take 1 tablet (500 mg total) by mouth 2 (two) times daily. 04/01/18   McDonald, Mia A, PA-C  naproxen sodium (ANAPROX DS) 550 MG tablet Take 1 tablet (550 mg total) by mouth 2 (two) times daily with a meal. 08/18/18   Janne Napoleon, NP    Family History Family History  Problem Relation Age of Onset  . Hypertension Father     Social History Social  History   Tobacco Use  . Smoking status: Current Every Day Smoker    Packs/day: 0.50    Types: Cigarettes  . Smokeless tobacco: Never Used  Substance Use Topics  . Alcohol use: Yes  . Drug use: Yes    Types: Marijuana     Allergies   Patient has no known allergies.   Review of Systems Review of Systems  Constitutional: Negative for fever.  Gastrointestinal: Negative for abdominal pain, nausea and vomiting.  Genitourinary: Positive for dysuria and frequency. Negative for flank pain, vaginal bleeding and vaginal discharge.     Physical Exam Updated Vital Signs BP (!) 132/94 (BP Location: Right Arm)   Pulse 86   Temp 98.5 F (36.9 C) (Oral)   Resp 18   LMP 10/13/2018 (Exact Date)   SpO2 100%   Physical Exam Vitals signs and nursing note reviewed.  Constitutional:      General: She is not in acute distress.    Appearance: She is well-developed. She is not toxic-appearing.  HENT:     Head: Normocephalic and atraumatic.  Eyes:     Conjunctiva/sclera: Conjunctivae normal.  Cardiovascular:     Rate and Rhythm: Normal rate.  Pulmonary:     Effort: Pulmonary effort is normal.  Abdominal:  General: Bowel sounds are normal. There is no distension.     Palpations: Abdomen is soft.     Tenderness: There is no abdominal tenderness.  Neurological:     Mental Status: She is alert.  Psychiatric:        Mood and Affect: Mood normal.        Behavior: Behavior normal.      ED Treatments / Results  Labs (all labs ordered are listed, but only abnormal results are displayed) Labs Reviewed  URINALYSIS, ROUTINE W REFLEX MICROSCOPIC - Abnormal; Notable for the following components:      Result Value   Color, Urine AMBER (*)    Nitrite POSITIVE (*)    Leukocytes,Ua TRACE (*)    Bacteria, UA MANY (*)    All other components within normal limits  URINE CULTURE  PREGNANCY, URINE    EKG None  Radiology No results found.  Procedures Procedures (including critical  care time)  Medications Ordered in ED Medications - No data to display   Initial Impression / Assessment and Plan / ED Course  I have reviewed the triage vital signs and the nursing notes.  Pertinent labs & imaging results that were available during my care of the patient were reviewed by me and considered in my medical decision making (see chart for details).     Pt with dysuria, increased frequency and malodorous urine x1 week.  UA consistent with UTI.  Culture sent.  Pt is afebrile, no CVA tenderness, normotensive, and denies N/V. Pt to be dc home with antibiotics and instructions to follow up with PCP if symptoms persist.  Discussed results, findings, treatment and follow up. Patient advised of return precautions. Patient verbalized understanding and agreed with plan.  Final Clinical Impressions(s) / ED Diagnoses   Final diagnoses:  Acute cystitis without hematuria    ED Discharge Orders         Ordered    cephALEXin (KEFLEX) 500 MG capsule  3 times daily     11/04/18 1250           Sebastien Jackson, [place] N, [place] JerseyPA-C 11/04/18 1250    Wynetta FinesMessick, Peter C, MD 11/12/18 1455

## 2018-11-04 NOTE — Discharge Instructions (Signed)
Please read instructions below. Take the antibiotic, keflex, as directed until it is gone. Drink plenty of water. Schedule an appointment with your primary care provider to follow up in 1 week. Discuss thyroid testing. Return to the ER if you develop a fever, have nausea, back pain, or new or concerning symptoms.

## 2018-11-04 NOTE — ED Triage Notes (Signed)
Pt presents for evaluation of dysuria x 1 week. Denies fever, vaginal discharge, bleeding or vomiting.

## 2018-11-06 LAB — URINE CULTURE

## 2018-11-07 ENCOUNTER — Telehealth: Payer: Self-pay | Admitting: *Deleted

## 2018-11-07 NOTE — Telephone Encounter (Signed)
Post ED Visit - Positive Culture Follow-up  Culture report reviewed by antimicrobial stewardship pharmacist:  []  Enzo Bi, Pharm.D. []  Celedonio Miyamoto, Pharm.D., BCPS AQ-ID []  Garvin Fila, Pharm.D., BCPS []  Georgina Pillion, Pharm.D., BCPS []  Patriot, [address].D., BCPS, AAHIVP []  Estella Husk, Pharm.D., BCPS, AAHIVP []  Lysle Pearl, PharmD, BCPS []  Phillips Climes, PharmD, BCPS []  Agapito Games, PharmD, BCPS []  Verlan Friends, PharmD Mervyn Gay, PharmD  Positive Urine culture Treated with Cephalexin, organism sensitive to the same and no further patient follow-up is required at this time.  Virl Axe [facility] 11/07/2018, 12:19 PM

## 2020-02-16 ENCOUNTER — Encounter (HOSPITAL_COMMUNITY): Payer: Self-pay

## 2020-02-16 ENCOUNTER — Other Ambulatory Visit: Payer: Self-pay

## 2020-02-16 ENCOUNTER — Ambulatory Visit (HOSPITAL_COMMUNITY)
Admission: EM | Admit: 2020-02-16 | Discharge: 2020-02-16 | Disposition: A | Discharge status: Home / Self Care | Payer: 59 | Attending: Emergency Medicine | Admitting: Emergency Medicine

## 2020-02-16 DIAGNOSIS — N760 Acute vaginitis: Secondary | ICD-10-CM | POA: Diagnosis present

## 2020-02-16 DIAGNOSIS — Z3202 Encounter for pregnancy test, result negative: Secondary | ICD-10-CM | POA: Diagnosis not present

## 2020-02-16 DIAGNOSIS — R300 Dysuria: Secondary | ICD-10-CM

## 2020-02-16 DIAGNOSIS — N898 Other specified noninflammatory disorders of vagina: Secondary | ICD-10-CM | POA: Diagnosis not present

## 2020-02-16 LAB — POCT URINALYSIS DIP (DEVICE)
Bilirubin Urine: NEGATIVE
Glucose, UA: NEGATIVE mg/dL
Hgb urine dipstick: NEGATIVE
Ketones, ur: NEGATIVE mg/dL
Leukocytes,Ua: NEGATIVE
Nitrite: NEGATIVE
Protein, ur: NEGATIVE mg/dL
Specific Gravity, Urine: >=1.03 (ref 1.005–1.030)
Urobilinogen, UA: 1 mg/dL (ref 0.0–1.0)
pH: 7 (ref 5.0–8.0)

## 2020-02-16 LAB — POC URINE PREG, ED: Preg Test, Ur: NEGATIVE

## 2020-02-16 MED ORDER — METRONIDAZOLE 500 MG PO TABS: 500.0000 mg | ORAL_TABLET | Freq: Two times a day (BID) | ORAL | 0 refills | Status: AC

## 2020-02-16 MED ORDER — FLUCONAZOLE 150 MG PO TABS: 150.0000 mg | ORAL_TABLET | Freq: Once | ORAL | 0 refills | Status: AC

## 2020-02-16 NOTE — ED Triage Notes (Signed)
Pt reports burning with urination and white clumpy vaginal discharged x 6 days. Pt requested STD's test.

## 2020-02-16 NOTE — ED Provider Notes (Signed)
[facility]    CSN: [id number] Arrival date & time: 02/16/20  6440      History   Chief Complaint Chief Complaint  Patient presents with  . Dysuria  . Vaginal Discharge    HPI Rebecca Salazar is a 24 y.o. female no significant past medical history presenting today for evaluation of vaginal discharge.  Patient reports over the past 6 days she has had increased and discharge which is described as thicker and clumpy.  She has also had some dysuria and urinary frequency.  She has history of prior UTIs as well as reports prior bacterial vaginosis.  She denies abdominal pain, nausea vomiting or fevers.  Last menstrual cycle was 4/28.  Is not on any form of birth control.  HPI  History reviewed. No pertinent past medical history.  Patient Active Problem List   Diagnosis Date Noted  . Anemia 09/01/2016  . SVD (spontaneous vaginal delivery) 09/01/2016  . Indication for care or intervention in labor or delivery 08/31/2016    History reviewed. No pertinent surgical history.  OB History    Gravida  1   Para  1   Term  1   Preterm  0   AB  0   Living  1     SAB  0   TAB  0   Ectopic  0   Multiple  0   Live Births  1            Home Medications    Prior to Admission medications   Medication Sig Start Date End Date Taking? Authorizing Provider  acyclovir (ZOVIRAX) 400 MG tablet  01/18/20   [provider]  fluconazole (DIFLUCAN) 150 MG tablet Take 1 tablet (150 mg total) by mouth once for 1 dose. 02/16/20 02/16/20  Bensen Chadderdon C, PA-C  metroNIDAZOLE (FLAGYL) 500 MG tablet Take 1 tablet (500 mg total) by mouth 2 (two) times daily for 7 days. 02/16/20 02/23/20  Jancarlos Thrun, Junius Creamer, PA-C    Family History Family History  Problem Relation Age of Onset  . Hypertension Father     Social History Social History   Tobacco Use  . Smoking status: Current Every Day Smoker    Packs/day: 0.50    Types: Cigarettes  . Smokeless tobacco:  Never Used  Substance Use Topics  . Alcohol use: Yes  . Drug use: Yes    Types: Marijuana     Allergies   Patient has no known allergies.   Review of Systems Review of Systems  Constitutional: Negative for fever.  Respiratory: Negative for shortness of breath.   Cardiovascular: Negative for chest pain.  Gastrointestinal: Negative for abdominal pain, diarrhea, nausea and vomiting.  Genitourinary: Positive for dysuria, frequency and vaginal discharge. Negative for flank pain, genital sores, hematuria, menstrual problem, vaginal bleeding and vaginal pain.  Musculoskeletal: Negative for back pain.  Skin: Negative for rash.  Neurological: Negative for dizziness, light-headedness and headaches.     Physical Exam Triage Vital Signs ED Triage Vitals [02/16/20 1000]  Enc Vitals Group     BP 131/76     Pulse Rate 76     Resp 17     Temp 99.1 F (37.3 C)     Temp Source Oral     SpO2      Weight      Height      Head Circumference      Peak Flow      Pain Score  Pain Loc      Pain Edu?      Excl. in GC?    No data found.  Updated Vital Signs BP 131/76 (BP Location: Right Arm)   Pulse 76   Temp 99.1 F (37.3 C) (Oral)   Resp 17   LMP 01/20/2020 (Exact Date)   Visual Acuity Right Eye Distance:   Left Eye Distance:   Bilateral Distance:    Right Eye Near:   Left Eye Near:    Bilateral Near:     Physical Exam Vitals and nursing note reviewed.  Constitutional:      Appearance: She is well-developed.     Comments: No acute distress  HENT:     Head: Normocephalic and atraumatic.     Nose: Nose normal.  Eyes:     Conjunctiva/sclera: Conjunctivae normal.  Cardiovascular:     Rate and Rhythm: Normal rate.  Pulmonary:     Effort: Pulmonary effort is normal. No respiratory distress.  Abdominal:     General: There is no distension.     Comments: Soft, nondistended, nontender to light and deep palpation throughout abdomen  Genitourinary:    Comments:  Deferred Musculoskeletal:        General: Normal range of motion.     Cervical back: Neck supple.  Skin:    General: Skin is warm and dry.  Neurological:     Mental Status: She is alert and oriented to person, place, and time.      UC Treatments / Results  Labs (all labs ordered are listed, but only abnormal results are displayed) Labs Reviewed  POC URINE PREG, ED  POCT URINALYSIS DIP (DEVICE)    EKG   Radiology No results found.  Procedures Procedures (including critical care time)  Medications Ordered in UC Medications - No data to display  Initial Impression / Assessment and Plan / UC Course  I have reviewed the triage vital signs and the nursing notes.  Pertinent labs & imaging results that were available during my care of the patient were reviewed by me and considered in my medical decision making (see chart for details).     Pregnancy test negative, UA unremarkable.  Most likely vaginitis, yeast versus BV.  Empirically treating for both given description of discharge along with urinary symptoms.  Diflucan and Flagyl prescribed.  STD screening pending.  Discussed strict return precautions. Patient verbalized understanding and is agreeable with plan.  Final Clinical Impressions(s) / UC Diagnoses   Final diagnoses:  Vaginal discharge  Acute vaginitis     Discharge Instructions     Take 1 tablet of Diflucan today to treat yeast.  May use other tablet after completion of metronidazole.  Take metronidazole/Flagyl twice daily for the next week to treat for bacterial vaginosis.No alcohol while taking.  We are testing you for Gonorrhea, Chlamydia, Trichomonas, Yeast and Bacterial Vaginosis. We will call you if anything is positive and let you know if you require any further treatment. Please inform partners of any positive results.   Please return if symptoms not improving with treatment, development of fever, nausea, vomiting, abdominal pain.    ED  Prescriptions    Medication Sig Dispense Auth. Provider   fluconazole (DIFLUCAN) 150 MG tablet Take 1 tablet (150 mg total) by mouth once for 1 dose. 2 tablet Shane Melby C, PA-C   metroNIDAZOLE (FLAGYL) 500 MG tablet Take 1 tablet (500 mg total) by mouth 2 (two) times daily for 7 days. 14 tablet Endre Coutts, Hidalgo C,  PA-C     PDMP not reviewed this encounter.   Janith Lima, PA-C 02/16/20 1029

## 2020-02-16 NOTE — Discharge Instructions (Signed)
Take 1 tablet of Diflucan today to treat yeast.  May use other tablet after completion of metronidazole.  Take metronidazole/Flagyl twice daily for the next week to treat for bacterial vaginosis.No alcohol while taking.  We are testing you for Gonorrhea, Chlamydia, Trichomonas, Yeast and Bacterial Vaginosis. We will call you if anything is positive and let you know if you require any further treatment. Please inform partners of any positive results.   Please return if symptoms not improving with treatment, development of fever, nausea, vomiting, abdominal pain.

## 2020-02-17 LAB — CERVICOVAGINAL ANCILLARY ONLY
Bacterial Vaginitis (gardnerella): POSITIVE — AB
Candida Glabrata: NEGATIVE
Candida Vaginitis: POSITIVE — AB
Chlamydia: NEGATIVE
Comment: NEGATIVE
Comment: NEGATIVE
Comment: NEGATIVE
Comment: NEGATIVE
Comment: NEGATIVE
Comment: NORMAL
Neisseria Gonorrhea: NEGATIVE
Trichomonas: NEGATIVE

## 2020-04-25 ENCOUNTER — Ambulatory Visit (HOSPITAL_COMMUNITY)
Admission: EM | Admit: 2020-04-25 | Discharge: 2020-04-25 | Disposition: A | Discharge status: Home / Self Care | Payer: 59 | Attending: Family Medicine | Admitting: Family Medicine

## 2020-04-25 ENCOUNTER — Encounter (HOSPITAL_COMMUNITY): Payer: Self-pay | Admitting: Emergency Medicine

## 2020-04-25 DIAGNOSIS — N898 Other specified noninflammatory disorders of vagina: Secondary | ICD-10-CM | POA: Diagnosis present

## 2020-04-25 DIAGNOSIS — Z3202 Encounter for pregnancy test, result negative: Secondary | ICD-10-CM | POA: Diagnosis not present

## 2020-04-25 DIAGNOSIS — H60502 Unspecified acute noninfective otitis externa, left ear: Secondary | ICD-10-CM

## 2020-04-25 LAB — POC URINE PREG, ED: Preg Test, Ur: NEGATIVE

## 2020-04-25 MED ORDER — METRONIDAZOLE 500 MG PO TABS: 500.0000 mg | ORAL_TABLET | Freq: Two times a day (BID) | ORAL | 0 refills | Status: DC

## 2020-04-25 MED ORDER — MUPIROCIN CALCIUM 2 % EX CREA: 1.0000 "application " | TOPICAL_CREAM | Freq: Two times a day (BID) | CUTANEOUS | 0 refills | Status: DC

## 2020-04-25 MED ORDER — CIPRO HC 0.2-1 % OT SUSP: 3.0000 [drp] | Freq: Two times a day (BID) | OTIC | 0 refills | Status: DC

## 2020-04-25 NOTE — ED Triage Notes (Signed)
Pt c/o right ear pain and swelling onset Thursday. Pt states the swelling began on Saturday. Pt also c/o vaginal discharge and slight odor.

## 2020-04-25 NOTE — ED Provider Notes (Signed)
[facility]    CSN: [id number] Arrival date & time: 04/25/20  0900      History   Chief Complaint Chief Complaint  Patient presents with  . Otalgia  . Vaginal Discharge    HPI Semiah Marter is a 24 y.o. female.   Patient is a 24 year old female who presents today with left ear pain.  This has been present since last Thursday.  There is some swelling to the external ear and she has had drainage from the ear.  Has been using a cotton ball in the ear.  Denies any fevers, chills, nasal congestion or rhinorrhea. Patient also complaining of vaginal discharge and odor.  History of similar in the past with BV.  Denies any concern for STDs at this time. Patient's last menstrual period was 03/27/2020.  ROS per HPI      History reviewed. No pertinent past medical history.  Patient Active Problem List   Diagnosis Date Noted  . Anemia 09/01/2016  . SVD (spontaneous vaginal delivery) 09/01/2016  . Indication for care or intervention in labor or delivery 08/31/2016    History reviewed. No pertinent surgical history.  OB History    Gravida  1   Para  1   Term  1   Preterm  0   AB  0   Living  1     SAB  0   TAB  0   Ectopic  0   Multiple  0   Live Births  1            Home Medications    Prior to Admission medications   Medication Sig Start Date End Date Taking? Authorizing Provider  acyclovir (ZOVIRAX) 400 MG tablet  01/18/20   [provider]  ciprofloxacin-hydrocortisone (CIPRO HC) OTIC suspension Place 3 drops into the left ear 2 (two) times daily. 04/25/20   Dahlia Byes A, NP  metroNIDAZOLE (FLAGYL) 500 MG tablet Take 1 tablet (500 mg total) by mouth 2 (two) times daily. 04/25/20   Dahlia Byes A, NP  mupirocin cream (BACTROBAN) 2 % Apply 1 application topically 2 (two) times daily. 04/25/20   Janace Aris, NP    Family History Family History  Problem Relation Age of Onset  . Hypertension Father     Social  History Social History   Tobacco Use  . Smoking status: Current Every Day Smoker    Packs/day: 0.50    Types: Cigarettes  . Smokeless tobacco: Never Used  Substance Use Topics  . Alcohol use: Yes  . Drug use: Yes    Types: Marijuana     Allergies   Patient has no known allergies.   Review of Systems Review of Systems   Physical Exam Triage Vital Signs ED Triage Vitals  Enc Vitals Group     BP 04/25/20 1013 (!) 136/84     Pulse Rate 04/25/20 1013 91     Resp 04/25/20 1013 16     Temp 04/25/20 1013 98.8 F (37.1 C)     Temp Source 04/25/20 1013 Oral     SpO2 04/25/20 1013 100 %     Weight --      Height --      Head Circumference --      Peak Flow --      Pain Score 04/25/20 1010 7     Pain Loc --      Pain Edu? --      Excl. in GC? --  No data found.  Updated Vital Signs BP (!) 136/84 (BP Location: Left Arm)   Pulse 91   Temp 98.8 F (37.1 C) (Oral)   Resp 16   LMP 03/27/2020   SpO2 100%   Visual Acuity Right Eye Distance:   Left Eye Distance:   Bilateral Distance:    Right Eye Near:   Left Eye Near:    Bilateral Near:     Physical Exam Vitals and nursing note reviewed.  Constitutional:      General: She is not in acute distress.    Appearance: Normal appearance. She is not ill-appearing, toxic-appearing or diaphoretic.  HENT:     Head: Normocephalic.     Left Ear: Tympanic membrane normal. Drainage present. No foreign body. No mastoid tenderness.     Ears:      Nose: Nose normal.  Eyes:     Conjunctiva/sclera: Conjunctivae normal.  Pulmonary:     Effort: Pulmonary effort is normal.  Musculoskeletal:        General: Normal range of motion.     Cervical back: Normal range of motion.  Skin:    General: Skin is warm and dry.     Findings: No rash.  Neurological:     Mental Status: She is alert.  Psychiatric:        Mood and Affect: Mood normal.      UC Treatments / Results  Labs (all labs ordered are listed, but only abnormal  results are displayed) Labs Reviewed  POC URINE PREG, ED  CERVICOVAGINAL ANCILLARY ONLY    EKG   Radiology No results found.  Procedures Procedures (including critical care time)  Medications Ordered in UC Medications - No data to display  Initial Impression / Assessment and Plan / UC Course  I have reviewed the triage vital signs and the nursing notes.  Pertinent labs & imaging results that were available during my care of the patient were reviewed by me and considered in my medical decision making (see chart for details).     Otitis externa left ear Treating with Cipro drops. Mupirocin cream for external ear  Vaginal discharge Patient with history of BV and feels this is same.  Denies any concern for STDs. Swab sent for testing. Treating for BV Final Clinical Impressions(s) / UC Diagnoses   Final diagnoses:  Acute otitis externa of left ear, unspecified type  Vaginal discharge     Discharge Instructions     Treating for an ear infection.  Use the eardrops as prescribed.  Apply the Bactroban cream to the external ear twice a day. Flagyl for bacterial infection We are sending a swab for testing. Follow up as needed for continued or worsening symptoms     ED Prescriptions    Medication Sig Dispense Auth. Provider   ciprofloxacin-hydrocortisone (CIPRO HC) OTIC suspension Place 3 drops into the left ear 2 (two) times daily. 10 mL Lorel Lembo A, NP   mupirocin cream (BACTROBAN) 2 % Apply 1 application topically 2 (two) times daily. 15 g Ronniesha Seibold A, NP   metroNIDAZOLE (FLAGYL) 500 MG tablet Take 1 tablet (500 mg total) by mouth 2 (two) times daily. 14 tablet Comfort Iversen A, NP     PDMP not reviewed this encounter.   Dahlia Byes A, NP 04/25/20 1053

## 2020-04-25 NOTE — Discharge Instructions (Signed)
Treating for an ear infection.  Use the eardrops as prescribed.  Apply the Bactroban cream to the external ear twice a day. Flagyl for bacterial infection We are sending a swab for testing. Follow up as needed for continued or worsening symptoms

## 2020-04-26 LAB — CERVICOVAGINAL ANCILLARY ONLY
Bacterial Vaginitis (gardnerella): POSITIVE — AB
Candida Glabrata: NEGATIVE
Candida Vaginitis: NEGATIVE
Chlamydia: NEGATIVE
Comment: NEGATIVE
Comment: NEGATIVE
Comment: NEGATIVE
Comment: NEGATIVE
Comment: NEGATIVE
Comment: NORMAL
Neisseria Gonorrhea: NEGATIVE
Trichomonas: NEGATIVE

## 2020-06-14 ENCOUNTER — Other Ambulatory Visit: Payer: Self-pay

## 2020-06-14 ENCOUNTER — Emergency Department (HOSPITAL_COMMUNITY)
Admission: EM | Admit: 2020-06-14 | Discharge: 2020-06-15 | Disposition: A | Discharge status: Home / Self Care | Payer: 59 | Attending: Emergency Medicine | Admitting: Emergency Medicine

## 2020-06-14 ENCOUNTER — Encounter (HOSPITAL_COMMUNITY): Payer: Self-pay | Admitting: Emergency Medicine

## 2020-06-14 DIAGNOSIS — N760 Acute vaginitis: Secondary | ICD-10-CM | POA: Insufficient documentation

## 2020-06-14 DIAGNOSIS — F17210 Nicotine dependence, cigarettes, uncomplicated: Secondary | ICD-10-CM | POA: Diagnosis not present

## 2020-06-14 DIAGNOSIS — N390 Urinary tract infection, site not specified: Secondary | ICD-10-CM | POA: Insufficient documentation

## 2020-06-14 DIAGNOSIS — R300 Dysuria: Secondary | ICD-10-CM | POA: Diagnosis present

## 2020-06-14 DIAGNOSIS — B9689 Other specified bacterial agents as the cause of diseases classified elsewhere: Secondary | ICD-10-CM

## 2020-06-14 NOTE — ED Triage Notes (Signed)
Patient is complaining of burning when urinating. Patient is complaining of condom broke while having sex and wants an std check and checked for uti.

## 2020-06-15 LAB — GC/CHLAMYDIA PROBE AMP (~~LOC~~) NOT AT ARMC
Chlamydia: NEGATIVE
Comment: NEGATIVE
Comment: NORMAL
Neisseria Gonorrhea: NEGATIVE

## 2020-06-15 LAB — WET PREP, GENITAL
Sperm: NONE SEEN
Trich, Wet Prep: NONE SEEN
WBC, Wet Prep HPF POC: NONE SEEN
Yeast Wet Prep HPF POC: NONE SEEN

## 2020-06-15 LAB — URINALYSIS, ROUTINE W REFLEX MICROSCOPIC
Bilirubin Urine: NEGATIVE
Glucose, UA: NEGATIVE mg/dL
Hgb urine dipstick: NEGATIVE
Ketones, ur: 5 mg/dL — AB
Nitrite: NEGATIVE
Protein, ur: 100 mg/dL — AB
RBC / HPF: >50 RBC/hpf — ABNORMAL HIGH (ref 0–5)
Specific Gravity, Urine: 1.032 — ABNORMAL HIGH (ref 1.005–1.030)
WBC, UA: >50 WBC/hpf — ABNORMAL HIGH (ref 0–5)
pH: 6 (ref 5.0–8.0)

## 2020-06-15 LAB — PREGNANCY, URINE: Preg Test, Ur: NEGATIVE

## 2020-06-15 MED ORDER — CEPHALEXIN 500 MG PO CAPS: 500.0000 mg | ORAL_CAPSULE | Freq: Four times a day (QID) | ORAL | 0 refills | Status: DC

## 2020-06-15 MED ORDER — METRONIDAZOLE 0.75 % VA GEL: 1.0000 | Freq: Two times a day (BID) | VAGINAL | 0 refills | Status: DC

## 2020-06-15 MED ORDER — CEPHALEXIN 500 MG PO CAPS
500.0000 mg | ORAL_CAPSULE | Freq: Once | ORAL | Status: AC
  Administered 2020-06-15: 500 mg via ORAL
  Filled 2020-06-15: qty 1

## 2020-06-15 NOTE — Discharge Instructions (Signed)
Take medications until gone as prescribed.   Please see your primary care provider for routine medical concerns. Return to the emergency department with any urgent medical conditions.

## 2020-06-15 NOTE — ED Provider Notes (Signed)
[facility]-EMERGENCY DEPT Provider Note   CSN: [id number] Arrival date & time: 06/14/20  2248     History Chief Complaint  Patient presents with  . Recurrent UTI    Rebecca Salazar is a 24 y.o. female.  Patient to ED with complaint of dysuria and vaginal odor for the past 2 days. No fever, nausea or vomiting. She reports history of UTI with similar symptoms, however, also expresses a concern for exposure to STD after a condom broke one week ago. She denies vaginal discharge or abnormal vaginal bleeding.   The history is provided by the patient. No language interpreter was used.       History reviewed. No pertinent past medical history.  Patient Active Problem List   Diagnosis Date Noted  . Anemia 09/01/2016  . SVD (spontaneous vaginal delivery) 09/01/2016  . Indication for care or intervention in labor or delivery 08/31/2016    History reviewed. No pertinent surgical history.   OB History    Gravida  1   Para  1   Term  1   Preterm  0   AB  0   Living  1     SAB  0   TAB  0   Ectopic  0   Multiple  0   Live Births  1           Family History  Problem Relation Age of Onset  . Hypertension Father     Social History   Tobacco Use  . Smoking status: Current Every Day Smoker    Packs/day: 0.50    Types: Cigarettes  . Smokeless tobacco: Never Used  Substance Use Topics  . Alcohol use: Yes  . Drug use: Yes    Types: Marijuana    Home Medications Prior to Admission medications   Medication Sig Start Date End Date Taking? Authorizing Provider  acyclovir (ZOVIRAX) 400 MG tablet  01/18/20   [provider]  ciprofloxacin-hydrocortisone (CIPRO HC) OTIC suspension Place 3 drops into the left ear 2 (two) times daily. 04/25/20   Dahlia Byes A, NP  metroNIDAZOLE (FLAGYL) 500 MG tablet Take 1 tablet (500 mg total) by mouth 2 (two) times daily. 04/25/20   Dahlia Byes A, NP  mupirocin cream (BACTROBAN) 2 % Apply 1  application topically 2 (two) times daily. 04/25/20   Janace Aris, NP    Allergies    Patient has no known allergies.  Review of Systems   Review of Systems  Constitutional: Negative for chills and fever.  Gastrointestinal: Negative.  Negative for abdominal pain, nausea and vomiting.  Genitourinary: Positive for dysuria. Negative for flank pain, menstrual problem and vaginal discharge.  Musculoskeletal: Negative.   Skin: Negative.   Neurological: Negative.   Hematological: Negative for adenopathy.    Physical Exam Updated Vital Signs BP (!) 128/91 (BP Location: Left Arm)   Pulse 92   Temp 98.4 F (36.9 C) (Oral)   Resp 14   Ht 5\' 9"  (1.753 m)   Wt 95.3 kg   LMP 05/29/2020   SpO2 98%   BMI 31.01 kg/m   Physical Exam Vitals and nursing note reviewed.  Constitutional:      Appearance: She is well-developed.  Cardiovascular:     Rate and Rhythm: Normal rate.  Pulmonary:     Effort: Pulmonary effort is normal.  Genitourinary:    General: Normal vulva.     Comments: Thin white vaginal discharge present. Uterine tenderness. No adnexal mass or tenderness.  Musculoskeletal:        General: Normal range of motion.     Cervical back: Normal range of motion.  Skin:    General: Skin is warm and dry.  Neurological:     Mental Status: She is alert and oriented to person, place, and time.     ED Results / Procedures / Treatments   Labs (all labs ordered are listed, but only abnormal results are displayed) Labs Reviewed  WET PREP, GENITAL - Abnormal; Notable for the following components:      Result Value   Clue Cells Wet Prep HPF POC PRESENT (*)    All other components within normal limits  URINALYSIS, ROUTINE W REFLEX MICROSCOPIC - Abnormal; Notable for the following components:   APPearance CLOUDY (*)    Specific Gravity, Urine 1.032 (*)    Ketones, ur 5 (*)    Protein, ur 100 (*)    Leukocytes,Ua LARGE (*)    RBC / HPF >50 (*)    WBC, UA >50 (*)    Bacteria, UA  FEW (*)    Non Squamous Epithelial 0-5 (*)    All other components within normal limits  PREGNANCY, URINE  GC/CHLAMYDIA PROBE AMP ([place]) NOT AT [facility]    EKG None  Radiology No results found.  Procedures Procedures (including critical care time)  Medications Ordered in ED Medications  cephALEXin (KEFLEX) capsule 500 mg (has no administration in time range)    ED Course  I have reviewed the triage vital signs and the nursing notes.  Pertinent labs & imaging results that were available during my care of the patient were reviewed by me and considered in my medical decision making (see chart for details).    MDM Rules/Calculators/A&P                          Patient to ED with dysuria similar to previous UTI. No fever. Also complains of concern for STD due to broken condom.   She is overall well appearing. Pelvic exam nonconcerning. There is vaginal odor. She has WBCs and RBCs on UA, with few bacteria. Culture pending. Will provide 3 day UTI treatment. Wet prep + for BV. Metrogel provided as well.   Final Clinical Impression(s) / ED Diagnoses Final diagnoses:  None   1. UTI 2. BV  Rx / DC Orders ED Discharge Orders    None       Elpidio Anis, PA-C 06/15/20 0146    Nira Conn, MD 06/15/20 [phone]

## 2020-06-19 ENCOUNTER — Encounter (HOSPITAL_COMMUNITY): Payer: Self-pay | Admitting: Emergency Medicine

## 2020-06-19 ENCOUNTER — Emergency Department (HOSPITAL_COMMUNITY)
Admission: EM | Admit: 2020-06-19 | Discharge: 2020-06-19 | Disposition: A | Discharge status: Home / Self Care | Payer: 59 | Attending: Emergency Medicine | Admitting: Emergency Medicine

## 2020-06-19 ENCOUNTER — Other Ambulatory Visit: Payer: Self-pay

## 2020-06-19 DIAGNOSIS — F17210 Nicotine dependence, cigarettes, uncomplicated: Secondary | ICD-10-CM | POA: Diagnosis not present

## 2020-06-19 DIAGNOSIS — Z20822 Contact with and (suspected) exposure to covid-19: Secondary | ICD-10-CM | POA: Insufficient documentation

## 2020-06-19 DIAGNOSIS — H60333 Swimmer's ear, bilateral: Secondary | ICD-10-CM | POA: Diagnosis not present

## 2020-06-19 DIAGNOSIS — H9203 Otalgia, bilateral: Secondary | ICD-10-CM | POA: Diagnosis present

## 2020-06-19 LAB — RESPIRATORY PANEL BY RT PCR (FLU A&B, COVID)
Influenza A by PCR: NEGATIVE
Influenza B by PCR: NEGATIVE
SARS Coronavirus 2 by RT PCR: NEGATIVE

## 2020-06-19 MED ORDER — CIPRO HC 0.2-1 % OT SUSP: 3.0000 [drp] | Freq: Two times a day (BID) | OTIC | 0 refills | Status: DC

## 2020-06-19 MED ORDER — CIPRO HC 0.2-1 % OT SUSP: 3.0000 [drp] | Freq: Two times a day (BID) | OTIC | 0 refills | Status: AC

## 2020-06-19 NOTE — ED Triage Notes (Signed)
Pt. Stated, Ive had both ears to be swollen and hurt for about a week.

## 2020-06-19 NOTE — ED Notes (Signed)
This nurse went to patients room to discharge her and she was not in room.  Called pt at [phone], pt answered and stated she had to leave.  Asked for her pharmacy information, [facility] on [address], [place] will send prescription to that pharmacy.  Pt verbalized understanding of going to pick prescription up, prescription instructions, and discharge instructions.

## 2020-06-19 NOTE — ED Triage Notes (Signed)
Pt. Stated, Rebecca Salazar also been exposed to COVID.

## 2020-06-19 NOTE — Discharge Instructions (Addendum)
At this time there does not appear to be the presence of an emergent medical condition, however there is always the potential for conditions to change. Please read and follow the below instructions.  Please return to the Emergency Department immediately for any new or worsening symptoms or if your symptoms do not improve within 3 days. Please be sure to follow up with your Primary Care Provider within one week regarding your visit today; please call their office to schedule an appointment even if you are feeling better for a follow-up visit. Please take your antibiotic Cipro/Hydrocortisone as prescribed to help with your symptoms.  Please drink enough water to avoid dehydration and get plenty of rest.  Stop putting rubbing alcohol in your ears.  Go to the nearest Emergency Department immediately if: You have fever or chills You have a fever. Your ear is still red, swollen, or painful after 3 days. You still have pus coming from your ear after 3 days. Your redness, swelling, or pain gets worse. You have a really bad headache. You have redness, swelling, pain, or tenderness behind your ear. You have any new/concerning or worsening of symptoms  Please read the additional information packets attached to your discharge summary.  Do not take your medicine if  develop an itchy rash, swelling in your mouth or lips, or difficulty breathing; call 911 and seek immediate emergency medical attention if this occurs.  You may review your lab tests and imaging results in their entirety on your MyChart account.  Please discuss all results of fully with your primary care provider and other specialist at your follow-up visit.  Note: Portions of this text may have been transcribed using voice recognition software. Every effort was made to ensure accuracy; however, inadvertent computerized transcription errors may still be present.

## 2020-06-19 NOTE — ED Provider Notes (Signed)
[facility] EMERGENCY DEPARTMENT Provider Note   CSN: [id number] Arrival date & time: 06/19/20  0920     History Chief Complaint  Patient presents with  . Otalgia  . Headache    Sherita Tweedy is a 24 y.o. female presents today for concern of bilateral ear pain and drainage.  Patient reports she went swimming with her friends around a week and 1/2-2 weeks ago.  A few days later she developed pain and drainage to both of her ear she describes a mild burning pain constant nonradiating no alleviating factors, worse with palpation.  She noticed some clear drainage from her ears and has been applying rubbing alcohol via Q-tip to both of her ear canals daily for the past week.  Additionally patient is requesting a Covid test today, she reports her child's father's girlfriend has Covid and patient is unvaccinated.  Denies fever/chills, headache, vision changes, tinnitus, hearing loss, swelling of the face/head/neck, sore throat, difficulty swallowing, voice change, neck pain/stiffness, cough, chest pain/shortness of breath, abdominal pain, nausea/vomiting, diarrhea or any additional concerns.  HPI     History reviewed. No pertinent past medical history.  Patient Active Problem List   Diagnosis Date Noted  . Anemia 09/01/2016  . SVD (spontaneous vaginal delivery) 09/01/2016  . Indication for care or intervention in labor or delivery 08/31/2016    History reviewed. No pertinent surgical history.   OB History    Gravida  1   Para  1   Term  1   Preterm  0   AB  0   Living  1     SAB  0   TAB  0   Ectopic  0   Multiple  0   Live Births  1           Family History  Problem Relation Age of Onset  . Hypertension Father     Social History   Tobacco Use  . Smoking status: Current Every Day Smoker    Packs/day: 0.50    Types: Cigarettes  . Smokeless tobacco: Never Used  Substance Use Topics  . Alcohol use: Yes  . Drug use: Yes     Types: Marijuana    Home Medications Prior to Admission medications   Medication Sig Start Date End Date Taking? Authorizing Provider  acyclovir (ZOVIRAX) 400 MG tablet  01/18/20   [provider]  cephALEXin (KEFLEX) 500 MG capsule Take 1 capsule (500 mg total) by mouth 4 (four) times daily. 06/15/20   Elpidio Anis, PA-C  ciprofloxacin-hydrocortisone (CIPRO HC) OTIC suspension Place 3 drops into both ears 2 (two) times daily for 7 days. 06/19/20 06/26/20  Harlene Salts A, PA-C  metroNIDAZOLE (FLAGYL) 500 MG tablet Take 1 tablet (500 mg total) by mouth 2 (two) times daily. 04/25/20   Dahlia Byes A, NP  metroNIDAZOLE (METROGEL VAGINAL) 0.75 % vaginal gel Place 1 Applicatorful vaginally 2 (two) times daily. 06/15/20   Elpidio Anis, PA-C  mupirocin cream (BACTROBAN) 2 % Apply 1 application topically 2 (two) times daily. 04/25/20   Janace Aris, NP    Allergies    Patient has no known allergies.  Review of Systems   Review of Systems Ten systems are reviewed and are negative for acute change except as noted in the HPI  Physical Exam Updated Vital Signs BP 95/66 (BP Location: Right Arm)   Pulse 67   Temp 98.2 F (36.8 C) (Oral)   Resp 16   Ht 5\' 9"  (1.753  m)   Wt 95.3 kg   LMP 05/29/2020   SpO2 100%   BMI 31.01 kg/m   Physical Exam Constitutional:      General: She is not in acute distress.    Appearance: Normal appearance. She is well-developed. She is not ill-appearing or diaphoretic.  HENT:     Head: Normocephalic and atraumatic.     Jaw: There is normal jaw occlusion. No trismus.     Right Ear: External ear normal. Tenderness present. No mastoid tenderness. Tympanic membrane is not perforated, erythematous or bulging.     Left Ear: External ear normal. Tenderness present. No mastoid tenderness. Tympanic membrane is not perforated, erythematous or bulging.     Ears:      Comments: Crusting of the external ear canals with thin clear drainage.    Nose: Nose  normal.     Mouth/Throat:     Comments: The patient has normal phonation and is in control of secretions. No stridor.  Midline uvula without edema. Soft palate rises symmetrically. No tonsillar erythema, swelling or exudates. Tongue protrusion is normal, floor of mouth is soft. No trismus. No creptius on neck palpation. No gingival erythema or fluctuance noted. Mucus membranes moist. No pallor noted. Eyes:     General: Vision grossly intact. Gaze aligned appropriately.     Extraocular Movements: Extraocular movements intact.     Pupils: Pupils are equal, round, and reactive to light.     Comments: No pain with extraocular motions.  Neck:     Trachea: Trachea and phonation normal. No tracheal tenderness or tracheal deviation.     Meningeal: Brudzinski's sign absent.  Cardiovascular:     Rate and Rhythm: Normal rate and regular rhythm.  Pulmonary:     Effort: Pulmonary effort is normal. No respiratory distress.     Breath sounds: Normal breath sounds.  Abdominal:     General: There is no distension.     Palpations: Abdomen is soft.     Tenderness: There is no abdominal tenderness. There is no guarding or rebound.  Musculoskeletal:        General: Normal range of motion.     Cervical back: Normal range of motion and neck supple.  Skin:    General: Skin is warm and dry.  Neurological:     Mental Status: She is alert.     GCS: GCS eye subscore is 4. GCS verbal subscore is 5. GCS motor subscore is 6.     Comments: Speech is clear and goal oriented, follows commands Major Cranial nerves without deficit, no facial droop Moves extremities without ataxia, coordination intact  Psychiatric:        Behavior: Behavior normal.     ED Results / Procedures / Treatments   Labs (all labs ordered are listed, but only abnormal results are displayed) Labs Reviewed  RESPIRATORY PANEL BY RT PCR (FLU A&B, COVID)    EKG None  Radiology No results found.  Procedures Procedures (including  critical care time)  Medications Ordered in ED Medications - No data to display  ED Course  I have reviewed the triage vital signs and the nursing notes.  Pertinent labs & imaging results that were available during my care of the patient were reviewed by me and considered in my medical decision making (see chart for details).    MDM Rules/Calculators/A&P                         Additional history  obtained from: 1. Nursing notes from this visit. ------------------------------ 25 year old female presented today for primary complaint of bilateral ear pain and drainage that began after swimming around 2 weeks ago.  She has been applying rubbing alcohol into her ears without relief.  On exam she has some crusting and some thin serosanguineous drainage.  TM intact.  Airway clear.  No evidence of malignant otitis externa or mastoiditis, otitis media or other deep space infections.  Overall she is well-appearing no acute distress.  Chart review shows patient was treated with ciprofloxacin/hydrocortisone at the beginning of August for otitis externa of the left ear which completely resolved, this appears to be a new infection after swimming and not a recurrence of an old infection.  She appears stable for outpatient treatment of her otitis externa, ENT referral given.  Patient informed to stop putting rubbing alcohol into her ear canals.  Additionally patient is requesting Covid test she is unvaccinated and reports her child's father's girlfriend has Covid.  Patient has no symptoms suggestive of Covid today and Covid test was negative in the ER.  She has no respiratory complaints there is no indication for chest x-ray or further work-up at this time.  At this time there does not appear to be any evidence of an acute emergency medical condition and the patient appears stable for discharge with appropriate outpatient follow up. Diagnosis was discussed with patient who verbalizes understanding of care plan and  is agreeable to discharge. I have discussed return precautions with patient who verbalizes understanding. Patient encouraged to follow-up with their PCP. All questions answered.   Jessikah Goodwine-Trinidad was evaluated in Emergency Department on 06/19/2020 for the symptoms described in the history of present illness. She was evaluated in the context of the global COVID-19 pandemic, which necessitated consideration that the patient might be at risk for infection with the SARS-CoV-2 virus that causes COVID-19. Institutional protocols and algorithms that pertain to the evaluation of patients at risk for COVID-19 are in a state of rapid change based on information released by regulatory bodies including the CDC and federal and state organizations. These policies and algorithms were followed during the patient's care in the ED.  Note: Portions of this report may have been transcribed using voice recognition software. Every effort was made to ensure accuracy; however, inadvertent computerized transcription errors may still be present. Final Clinical Impression(s) / ED Diagnoses Final diagnoses:  Acute swimmer's ear of both sides  Encounter for screening laboratory testing for COVID-19 virus    Rx / DC Orders ED Discharge Orders         Ordered    ciprofloxacin-hydrocortisone (CIPRO HC) OTIC suspension  2 times daily        06/19/20 1216           Elizabeth Palau 06/19/20 1219    Tilden Fossa, MD 06/19/20 1310

## 2020-07-16 ENCOUNTER — Encounter (HOSPITAL_COMMUNITY): Payer: Self-pay

## 2020-07-16 ENCOUNTER — Emergency Department (HOSPITAL_COMMUNITY): Payer: 59

## 2020-07-16 ENCOUNTER — Emergency Department (HOSPITAL_COMMUNITY)
Admission: EM | Admit: 2020-07-16 | Discharge: 2020-07-16 | Disposition: A | Discharge status: Home / Self Care | Payer: 59 | Attending: Emergency Medicine | Admitting: Emergency Medicine

## 2020-07-16 ENCOUNTER — Other Ambulatory Visit: Payer: Self-pay

## 2020-07-16 DIAGNOSIS — Y92410 Unspecified street and highway as the place of occurrence of the external cause: Secondary | ICD-10-CM | POA: Diagnosis not present

## 2020-07-16 DIAGNOSIS — R1010 Upper abdominal pain, unspecified: Secondary | ICD-10-CM | POA: Insufficient documentation

## 2020-07-16 DIAGNOSIS — S01512A Laceration without foreign body of oral cavity, initial encounter: Secondary | ICD-10-CM

## 2020-07-16 DIAGNOSIS — R0781 Pleurodynia: Secondary | ICD-10-CM | POA: Insufficient documentation

## 2020-07-16 DIAGNOSIS — S0083XA Contusion of other part of head, initial encounter: Secondary | ICD-10-CM

## 2020-07-16 DIAGNOSIS — F17210 Nicotine dependence, cigarettes, uncomplicated: Secondary | ICD-10-CM | POA: Diagnosis not present

## 2020-07-16 DIAGNOSIS — S069X1A Unspecified intracranial injury with loss of consciousness of 30 minutes or less, initial encounter: Secondary | ICD-10-CM | POA: Diagnosis present

## 2020-07-16 DIAGNOSIS — S20212A Contusion of left front wall of thorax, initial encounter: Secondary | ICD-10-CM | POA: Insufficient documentation

## 2020-07-16 DIAGNOSIS — Z23 Encounter for immunization: Secondary | ICD-10-CM | POA: Insufficient documentation

## 2020-07-16 LAB — CBC WITH DIFFERENTIAL/PLATELET
Abs Immature Granulocytes: 0.06 10*3/uL (ref 0.00–0.07)
Basophils Absolute: 0 10*3/uL (ref 0.0–0.1)
Basophils Relative: 0 %
Eosinophils Absolute: 0 10*3/uL (ref 0.0–0.5)
Eosinophils Relative: 0 %
HCT: 38.4 % (ref 36.0–46.0)
Hemoglobin: 11.9 g/dL — ABNORMAL LOW (ref 12.0–15.0)
Immature Granulocytes: 0 %
Lymphocytes Relative: 23 %
Lymphs Abs: 3.5 10*3/uL (ref 0.7–4.0)
MCH: 23.9 pg — ABNORMAL LOW (ref 26.0–34.0)
MCHC: 31 g/dL (ref 30.0–36.0)
MCV: 77.3 fL — ABNORMAL LOW (ref 80.0–100.0)
Monocytes Absolute: 0.6 10*3/uL (ref 0.1–1.0)
Monocytes Relative: 4 %
Neutro Abs: 11.1 10*3/uL — ABNORMAL HIGH (ref 1.7–7.7)
Neutrophils Relative %: 73 %
Platelets: 449 10*3/uL — ABNORMAL HIGH (ref 150–400)
RBC: 4.97 MIL/uL (ref 3.87–5.11)
RDW: 19.6 % — ABNORMAL HIGH (ref 11.5–15.5)
WBC: 15.4 10*3/uL — ABNORMAL HIGH (ref 4.0–10.5)
nRBC: 0 % (ref 0.0–0.2)

## 2020-07-16 LAB — COMPREHENSIVE METABOLIC PANEL
ALT: 18 U/L (ref 0–44)
AST: 33 U/L (ref 15–41)
Albumin: 4.4 g/dL (ref 3.5–5.0)
Alkaline Phosphatase: 86 U/L (ref 38–126)
Anion gap: 15 (ref 5–15)
BUN: 13 mg/dL (ref 6–20)
CO2: 23 mmol/L (ref 22–32)
Calcium: 9.2 mg/dL (ref 8.9–10.3)
Chloride: 105 mmol/L (ref 98–111)
Creatinine, Ser: 0.91 mg/dL (ref 0.44–1.00)
GFR, Estimated: >60 mL/min (ref >60)
Glucose, Bld: 98 mg/dL (ref 70–99)
Potassium: 3.4 mmol/L — ABNORMAL LOW (ref 3.5–5.1)
Sodium: 143 mmol/L (ref 135–145)
Total Bilirubin: 0.5 mg/dL (ref 0.3–1.2)
Total Protein: 8.3 g/dL — ABNORMAL HIGH (ref 6.5–8.1)

## 2020-07-16 LAB — I-STAT BETA HCG BLOOD, ED (MC, WL, AP ONLY): I-stat hCG, quantitative: <5 m[IU]/mL (ref <5)

## 2020-07-16 MED ORDER — CYCLOBENZAPRINE HCL 10 MG PO TABS: 10.0000 mg | ORAL_TABLET | Freq: Two times a day (BID) | ORAL | 0 refills | Status: DC | PRN

## 2020-07-16 MED ORDER — IBUPROFEN 600 MG PO TABS: 600.0000 mg | ORAL_TABLET | Freq: Four times a day (QID) | ORAL | 0 refills | Status: DC | PRN

## 2020-07-16 MED ORDER — IOHEXOL 300 MG/ML  SOLN
100.0000 mL | Freq: Once | INTRAMUSCULAR | Status: AC | PRN
  Administered 2020-07-16: 100 mL via INTRAVENOUS

## 2020-07-16 MED ORDER — TETANUS-DIPHTH-ACELL PERTUSSIS 5-2.5-18.5 LF-MCG/0.5 IM SUSP
0.5000 mL | Freq: Once | INTRAMUSCULAR | Status: AC
  Administered 2020-07-16: 0.5 mL via INTRAMUSCULAR
  Filled 2020-07-16: qty 0.5

## 2020-07-16 MED ORDER — SODIUM CHLORIDE (PF) 0.9 % IJ SOLN
INTRAMUSCULAR | Status: AC
  Filled 2020-07-16: qty 50

## 2020-07-16 MED ORDER — FENTANYL CITRATE (PF) 100 MCG/2ML IJ SOLN
50.0000 ug | Freq: Once | INTRAMUSCULAR | Status: AC
  Administered 2020-07-16: 50 ug via INTRAVENOUS
  Filled 2020-07-16: qty 2
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
## 2020-07-16 NOTE — Discharge Instructions (Addendum)
Eat soft foods only.  Wash her mouth out after eating.  Follow-up with your primary care doctor if your symptoms are not improving.  Return here as needed for any worsening symptoms.
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
## 2020-09-02 ENCOUNTER — Encounter (HOSPITAL_COMMUNITY): Payer: Self-pay

## 2020-09-02 ENCOUNTER — Emergency Department (HOSPITAL_COMMUNITY)
Admission: EM | Admit: 2020-09-02 | Discharge: 2020-09-03 | Disposition: A | Discharge status: Home / Self Care | Payer: 59 | Attending: Emergency Medicine | Admitting: Emergency Medicine

## 2020-09-02 ENCOUNTER — Other Ambulatory Visit: Payer: Self-pay

## 2020-09-02 DIAGNOSIS — Z3A00 Weeks of gestation of pregnancy not specified: Secondary | ICD-10-CM | POA: Insufficient documentation

## 2020-09-02 DIAGNOSIS — O2340 Unspecified infection of urinary tract in pregnancy, unspecified trimester: Secondary | ICD-10-CM | POA: Insufficient documentation

## 2020-09-02 DIAGNOSIS — F17210 Nicotine dependence, cigarettes, uncomplicated: Secondary | ICD-10-CM | POA: Diagnosis not present

## 2020-09-02 DIAGNOSIS — Z3490 Encounter for supervision of normal pregnancy, unspecified, unspecified trimester: Secondary | ICD-10-CM

## 2020-09-02 DIAGNOSIS — O99330 Smoking (tobacco) complicating pregnancy, unspecified trimester: Secondary | ICD-10-CM | POA: Diagnosis not present

## 2020-09-02 NOTE — ED Notes (Signed)
initial contact with pt. Pt ambulatory to room with steady gait. Will continue to monitor.

## 2020-09-02 NOTE — ED Triage Notes (Signed)
Pt requesting pregnancy test after 3 positive home tests. Unsure of last menstrual cycle.

## 2020-09-03 ENCOUNTER — Telehealth (HOSPITAL_COMMUNITY): Payer: Self-pay | Admitting: Emergency Medicine

## 2020-09-03 LAB — URINALYSIS, ROUTINE W REFLEX MICROSCOPIC
Bilirubin Urine: NEGATIVE
Glucose, UA: NEGATIVE mg/dL
Hgb urine dipstick: NEGATIVE
Ketones, ur: NEGATIVE mg/dL
Nitrite: NEGATIVE
Protein, ur: 30 mg/dL — AB
Specific Gravity, Urine: 1.021 (ref 1.005–1.030)
WBC, UA: >50 WBC/hpf — ABNORMAL HIGH (ref 0–5)
pH: 7 (ref 5.0–8.0)

## 2020-09-03 LAB — I-STAT BETA HCG BLOOD, ED (MC, WL, AP ONLY): I-stat hCG, quantitative: >2000 m[IU]/mL — ABNORMAL HIGH (ref <5)

## 2020-09-03 NOTE — ED Notes (Signed)
Pt remains in bed awaiting provider. No complaints.

## 2020-09-03 NOTE — ED Provider Notes (Signed)
[facility]-EMERGENCY DEPT Provider Note: Lowella Dell, MD, FACEP  CSN: [id number] MRN: [id number] ARRIVAL: 09/02/20 at 2306 ROOM: WA06/[facility]06   CHIEF COMPLAINT  Possible Pregnancy   HISTORY OF PRESENT ILLNESS  09/03/20 1:58 AM Rebecca Salazar is a 24 y.o. female who is not sure of her last menstrual period.  She has had 3+ home pregnancy test.  She would like to know for sure if she is pregnant.  She denies vaginal bleeding or discharge.  She is having no abdominal pain.  She is having some discomfort with urination which is not severe.   History reviewed. No pertinent past medical history.  History reviewed. No pertinent surgical history.  Family History  Problem Relation Age of Onset  . Hypertension Father     Social History   Tobacco Use  . Smoking status: Current Every Day Smoker    Packs/day: 0.50    Types: Cigarettes  . Smokeless tobacco: Never Used  Substance Use Topics  . Alcohol use: Yes  . Drug use: Yes    Types: Marijuana    Prior to Admission medications   Medication Sig Start Date End Date Taking? Authorizing Provider  acyclovir (ZOVIRAX) 400 MG tablet  01/18/20  Yes [provider]  ibuprofen (ADVIL) 600 MG tablet Take 1 tablet (600 mg total) by mouth every 6 (six) hours as needed. 07/16/20  Yes Rolan Bucco, MD  cephALEXin (KEFLEX) 500 MG capsule Take 1 capsule (500 mg total) by mouth 4 (four) times daily. Patient not taking: No sig reported 06/15/20   Elpidio Anis, PA-C  cyclobenzaprine (FLEXERIL) 10 MG tablet Take 1 tablet (10 mg total) by mouth 2 (two) times daily as needed for muscle spasms. Patient not taking: No sig reported 07/16/20   Rolan Bucco, MD  metroNIDAZOLE (FLAGYL) 500 MG tablet Take 1 tablet (500 mg total) by mouth 2 (two) times daily. Patient not taking: No sig reported 04/25/20   Dahlia Byes A, NP  metroNIDAZOLE (METROGEL VAGINAL) 0.75 % vaginal gel Place 1 Applicatorful vaginally 2 (two) times daily. Patient not  taking: No sig reported 06/15/20   Elpidio Anis, PA-C  mupirocin cream (BACTROBAN) 2 % Apply 1 application topically 2 (two) times daily. Patient not taking: No sig reported 04/25/20   Janace Aris, NP    Allergies Patient has no known allergies.   REVIEW OF SYSTEMS  Negative except as noted here or in the History of Present Illness.   PHYSICAL EXAMINATION  Initial Vital Signs Blood pressure (!) 142/75, pulse 93, temperature 98.2 F (36.8 C), temperature source Oral, resp. rate 20, height 5\' 9"  (1.753 m), weight 90.7 kg, SpO2 100 %.  Examination General: Well-developed, well-nourished female in no acute distress; appearance consistent with age of record HENT: normocephalic; atraumatic Eyes: Cosmetic contact lenses; extraocular muscles intact Neck: supple Heart: regular rate and rhythm Lungs: clear to auscultation bilaterally Abdomen: soft; nondistended; mild suprapubic tenderness; bowel sounds present Extremities: No deformity; full range of motion Neurologic: Awake, alert and oriented; motor function intact in all extremities and symmetric; no facial droop Skin: Warm and dry Psychiatric: Normal mood and affect   RESULTS  Summary of this visit's results, reviewed and interpreted by myself:   EKG Interpretation  Date/Time:    Ventricular Rate:    PR Interval:    QRS Duration:   QT Interval:    QTC Calculation:   R Axis:     Text Interpretation:        Laboratory Studies: Results for orders placed  or performed during the hospital encounter of 09/02/20 (from the past 24 hour(s))  I-Stat beta hCG blood, ED     Status: Abnormal   Collection Time: 09/03/20  1:49 AM  Result Value Ref Range   I-stat hCG, quantitative >2,000.0 (H) <5 mIU/mL   Comment 3          Urinalysis, Routine w reflex microscopic     Status: Abnormal   Collection Time: 09/03/20  2:03 AM  Result Value Ref Range   Color, Urine YELLOW YELLOW   APPearance CLOUDY (A) CLEAR   Specific Gravity, Urine  1.021 1.005 - 1.030   pH 7.0 5.0 - 8.0   Glucose, UA NEGATIVE NEGATIVE mg/dL   Hgb urine dipstick NEGATIVE NEGATIVE   Bilirubin Urine NEGATIVE NEGATIVE   Ketones, ur NEGATIVE NEGATIVE mg/dL   Protein, ur 30 (A) NEGATIVE mg/dL   Nitrite NEGATIVE NEGATIVE   Leukocytes,Ua MODERATE (A) NEGATIVE   RBC / HPF 0-5 0 - 5 RBC/hpf   WBC, UA >50 (H) 0 - 5 WBC/hpf   Bacteria, UA RARE (A) NONE SEEN   Squamous Epithelial / LPF 21-50 0 - 5   Mucus PRESENT    Budding Yeast PRESENT    Imaging Studies: No results found.  ED COURSE and MDM  Nursing notes, initial and subsequent vitals signs, including pulse oximetry, reviewed and interpreted by myself.  Vitals:   09/02/20 2325 09/03/20 0042 09/03/20 0247  BP: 136/86 (!) 142/75 123/78  Pulse: 93 93 65  Resp: 18 20 18   Temp: 98.2 F (36.8 C)  98.2 F (36.8 C)  TempSrc: Oral  Oral  SpO2: 99% 100% 98%  Weight: 90.7 kg    Height: 5\' 9"  (1.753 m)     Medications - No data to display  3:19 AM Patient eloped without informing staff.  Will have nursing staff call and Keflex prescription to treat her urinary tract infection.  PROCEDURES  Procedures   ED DIAGNOSES     ICD-10-CM   1. UTI in pregnancy, antepartum  O23.40   2. Pregnancy, unspecified gestational age  Z64.90        , MD 09/03/20 [phone]

## 2020-09-03 NOTE — ED Notes (Signed)
Pt is asking to go home provider made aware.

## 2020-09-05 LAB — URINE CULTURE: Culture: >=100000 — AB

## 2020-09-06 NOTE — Progress Notes (Signed)
ED Antimicrobial Stewardship Positive Culture Follow Up   Rebecca Salazar is an 24 y.o. female who presented to [facility] on 09/02/2020 with a chief complaint of  Chief Complaint  Patient presents with  . Possible Pregnancy    Recent Results (from the past 720 hour(s))  Urine culture     Status: Abnormal   Collection Time: 09/03/20  2:04 AM   Specimen: Urine, Catheterized  Result Value Ref Range Status   Specimen Description   Final    URINE, CATHETERIZED Performed at [facility], [address] [place], [place] [address]    Special Requests   Final    NONE Performed at [facility], [address], [place], [place] [address]    Culture >=100,000 COLONIES/mL STAPHYLOCOCCUS SAPROPHYTICUS (A)  Final   Report Status 09/05/2020 FINAL  Final   Organism ID, Bacteria STAPHYLOCOCCUS SAPROPHYTICUS (A)  Final      Susceptibility   Staphylococcus saprophyticus - MIC*    CIPROFLOXACIN <=0.5 SENSITIVE Sensitive     GENTAMICIN <=0.5 SENSITIVE Sensitive     NITROFURANTOIN <=16 SENSITIVE Sensitive     OXACILLIN 0.5 RESISTANT Resistant     TETRACYCLINE <=1 SENSITIVE Sensitive     VANCOMYCIN 1 SENSITIVE Sensitive     TRIMETH/SULFA <=10 SENSITIVE Sensitive     CLINDAMYCIN <=0.25 SENSITIVE Sensitive     RIFAMPIN <=0.5 SENSITIVE Sensitive     Inducible Clindamycin NEGATIVE Sensitive     * >=100,000 COLONIES/mL STAPHYLOCOCCUS SAPROPHYTICUS   [x]  Patient discharged originally without antimicrobial agent and treatment is now indicated - pt eloped without informing staff  New antibiotic prescription: nitrofurantion 100 mg PO BID x  5 days  ED Provider: PA-C  Dietrich Pates, Pharm.D 09/06/2020 10:16 AM Clinical Pharmacist [phone]

## 2020-09-06 NOTE — Telephone Encounter (Signed)
Post ED Visit - Positive Culture Follow-up: Successful Patient Follow-Up  Culture assessed and recommendations reviewed by:  []  , Pharm.D. []  Enzo Bi, Pharm.D., BCPS AQ-ID []  , Pharm.D., BCPS []  Celedonio Miyamoto, Pharm.D., BCPS []  [place], Garvin Fila.D., BCPS, AAHIVP []  , Pharm.D., BCPS, AAHIVP []  Georgina Pillion, PharmD, BCPS []  , PharmD, BCPS []  [place], PharmD, BCPS []  [place], PharmD PharmD  Positive urine culture  [x]  Patient discharged without antimicrobial prescription and treatment is now indicated []  Organism is resistant to prescribed ED discharge antimicrobial []  Patient with positive blood cultures  Changes discussed with ED provider: Estella Husk PA New antibiotic prescription start Nitrofurantoin 100mg  po bid x 5 days  Attempting to contact patient   09/06/2020, 11:06 AM

## 2020-09-09 ENCOUNTER — Other Ambulatory Visit: Payer: Self-pay

## 2020-09-09 ENCOUNTER — Encounter (HOSPITAL_COMMUNITY): Payer: Self-pay | Admitting: Obstetrics and Gynecology

## 2020-09-09 ENCOUNTER — Inpatient Hospital Stay (HOSPITAL_COMMUNITY)
Admission: AD | Admit: 2020-09-09 | Discharge: 2020-09-09 | Disposition: A | Discharge status: Home / Self Care | Payer: 59 | Attending: Obstetrics and Gynecology | Admitting: Obstetrics and Gynecology

## 2020-09-09 ENCOUNTER — Inpatient Hospital Stay (HOSPITAL_COMMUNITY): Payer: 59

## 2020-09-09 DIAGNOSIS — R1030 Lower abdominal pain, unspecified: Secondary | ICD-10-CM | POA: Insufficient documentation

## 2020-09-09 DIAGNOSIS — O99331 Smoking (tobacco) complicating pregnancy, first trimester: Secondary | ICD-10-CM | POA: Diagnosis not present

## 2020-09-09 DIAGNOSIS — Z3490 Encounter for supervision of normal pregnancy, unspecified, unspecified trimester: Secondary | ICD-10-CM

## 2020-09-09 DIAGNOSIS — R109 Unspecified abdominal pain: Secondary | ICD-10-CM

## 2020-09-09 DIAGNOSIS — F17210 Nicotine dependence, cigarettes, uncomplicated: Secondary | ICD-10-CM | POA: Insufficient documentation

## 2020-09-09 DIAGNOSIS — Z3A01 Less than 8 weeks gestation of pregnancy: Secondary | ICD-10-CM | POA: Diagnosis not present

## 2020-09-09 DIAGNOSIS — O26891 Other specified pregnancy related conditions, first trimester: Secondary | ICD-10-CM | POA: Insufficient documentation

## 2020-09-09 DIAGNOSIS — O26899 Other specified pregnancy related conditions, unspecified trimester: Secondary | ICD-10-CM

## 2020-09-09 LAB — COMPREHENSIVE METABOLIC PANEL
ALT: 14 U/L (ref 0–44)
AST: 20 U/L (ref 15–41)
Albumin: 3.7 g/dL (ref 3.5–5.0)
Alkaline Phosphatase: 66 U/L (ref 38–126)
Anion gap: 9 (ref 5–15)
BUN: 6 mg/dL (ref 6–20)
CO2: 23 mmol/L (ref 22–32)
Calcium: 9.1 mg/dL (ref 8.9–10.3)
Chloride: 103 mmol/L (ref 98–111)
Creatinine, Ser: 0.56 mg/dL (ref 0.44–1.00)
GFR, Estimated: >60 mL/min (ref >60)
Glucose, Bld: 100 mg/dL — ABNORMAL HIGH (ref 70–99)
Potassium: 3.8 mmol/L (ref 3.5–5.1)
Sodium: 135 mmol/L (ref 135–145)
Total Bilirubin: 0.6 mg/dL (ref 0.3–1.2)
Total Protein: 7.5 g/dL (ref 6.5–8.1)

## 2020-09-09 LAB — WET PREP, GENITAL
Clue Cells Wet Prep HPF POC: NONE SEEN
Sperm: NONE SEEN
Trich, Wet Prep: NONE SEEN
Yeast Wet Prep HPF POC: NONE SEEN

## 2020-09-09 LAB — HCG, QUANTITATIVE, PREGNANCY: hCG, Beta Chain, Quant, S: 32871 m[IU]/mL — ABNORMAL HIGH (ref <5)

## 2020-09-09 LAB — URINALYSIS, ROUTINE W REFLEX MICROSCOPIC
Bilirubin Urine: NEGATIVE
Glucose, UA: NEGATIVE mg/dL
Hgb urine dipstick: NEGATIVE
Ketones, ur: NEGATIVE mg/dL
Leukocytes,Ua: NEGATIVE
Nitrite: NEGATIVE
Protein, ur: NEGATIVE mg/dL
Specific Gravity, Urine: 1.017 (ref 1.005–1.030)
pH: 6 (ref 5.0–8.0)

## 2020-09-09 LAB — CBC
HCT: 33.8 % — ABNORMAL LOW (ref 36.0–46.0)
Hemoglobin: 11.2 g/dL — ABNORMAL LOW (ref 12.0–15.0)
MCH: 25.8 pg — ABNORMAL LOW (ref 26.0–34.0)
MCHC: 33.1 g/dL (ref 30.0–36.0)
MCV: 77.9 fL — ABNORMAL LOW (ref 80.0–100.0)
Platelets: 427 10*3/uL — ABNORMAL HIGH (ref 150–400)
RBC: 4.34 MIL/uL (ref 3.87–5.11)
RDW: 19.9 % — ABNORMAL HIGH (ref 11.5–15.5)
WBC: 12.7 10*3/uL — ABNORMAL HIGH (ref 4.0–10.5)
nRBC: 0 % (ref 0.0–0.2)

## 2020-09-09 MED ORDER — PREPLUS 27-1 MG PO TABS
1.0000 | ORAL_TABLET | Freq: Every day | ORAL | 13 refills | Status: DC
Start: 2020-09-09 — End: 2023-04-25

## 2020-09-09 NOTE — Discharge Instructions (Signed)
How a Baby Grows During Pregnancy  Pregnancy begins when a female's sperm enters a female's egg (fertilization). Fertilization usually happens in one of the tubes (fallopian tubes) that connect the ovaries to the womb (uterus). The fertilized egg moves down the fallopian tube to the uterus. Once it reaches the uterus, it implants into the lining of the uterus and begins to grow. For the first 10 weeks, the fertilized egg is called an embryo. After 10 weeks, it is called a fetus. As the fetus continues to grow, it receives oxygen and nutrients through tissue (placenta) that grows to support the developing baby. The placenta is the life support system for the baby. It provides oxygen and nutrition and removes waste. Learning as much as you can about your pregnancy and how your baby is developing can help you enjoy the experience. It can also make you aware of when there might be a problem and when to ask questions. How long does a typical pregnancy last? A pregnancy usually lasts 280 days, or about 40 weeks. Pregnancy is divided into three periods of growth, also called trimesters:  First trimester: 0-12 weeks.  Second trimester: 13-27 weeks.  Third trimester: 28-40 weeks. The day when your baby is ready to be born (full term) is your estimated date of delivery. How does my baby develop month by month? First month  The fertilized egg attaches to the inside of the uterus.  Some cells will form the placenta. Others will form the fetus.  The arms, legs, brain, spinal cord, lungs, and heart begin to develop.  At the end of the first month, the heart begins to beat. Second month  The bones, inner ear, eyelids, hands, and feet form.  The genitals develop.  By the end of 8 weeks, all major organs are developing. Third month  All of the internal organs are forming.  Teeth develop below the gums.  Bones and muscles begin to grow. The spine can flex.  The skin is transparent.  Fingernails  and toenails begin to form.  Arms and legs continue to grow longer, and hands and feet develop.  The fetus is about 3 inches (7.6 cm) long. Fourth month  The placenta is completely formed.  The external sex organs, neck, outer ear, eyebrows, eyelids, and fingernails are formed.  The fetus can hear, swallow, and move its arms and legs.  The kidneys begin to produce urine.  The skin is covered with a white, waxy coating (vernix) and very fine hair (lanugo). Fifth month  The fetus moves around more and can be felt for the first time (quickening).  The fetus starts to sleep and wake up and may begin to suck its finger.  The nails grow to the end of the fingers.  The organ in the digestive system that makes bile (gallbladder) functions and helps to digest nutrients.  If your baby is a girl, eggs are present in her ovaries. If your baby is a boy, testicles start to move down into his scrotum. Sixth month  The lungs are formed.  The eyes open. The brain continues to develop.  Your baby has fingerprints and toe prints. Your baby's hair grows thicker.  At the end of the second trimester, the fetus is about 9 inches (22.9 cm) long. Seventh month  The fetus kicks and stretches.  The eyes are developed enough to sense changes in light.  The hands can make a grasping motion.  The fetus responds to sound. Eighth month  All   organs and body systems are fully developed and functioning. °· Bones harden, and taste buds develop. The fetus may hiccup. °· Certain areas of the brain are still developing. The skull remains soft. °Ninth month °· The fetus gains about ½ lb (0.23 kg) each week. °· The lungs are fully developed. °· Patterns of sleep develop. °· The fetus's head typically moves into a head-down position (vertex) in the uterus to prepare for birth. °· The fetus weighs 6-9 lb (2.72-4.08 kg) and is 19-20 inches (48.26-50.8 cm) long. °What can I do to have a healthy pregnancy and help  my baby develop? °General instructions °· Take prenatal vitamins as directed by your health care provider. These include vitamins such as folic acid, iron, calcium, and vitamin D. They are important for healthy development. °· Take medicines only as directed by your health care provider. Read labels and ask a pharmacist or your health care provider whether over-the-counter medicines, supplements, and prescription drugs are safe to take during pregnancy. °· Keep all follow-up visits as directed by your health care provider. This is important. Follow-up visits include prenatal care and screening tests. °How do I know if my baby is developing well? °At each prenatal visit, your health care provider will do several different tests to check on your health and keep track of your baby's development. These include: °· Fundal height and position. °? Your health care provider will measure your growing belly from your pubic bone to the top of the uterus using a tape measure. °? Your health care provider will also feel your belly to determine your baby's position. °· Heartbeat. °? An ultrasound in the first trimester can confirm pregnancy and show a heartbeat, depending on how far along you are. °? Your health care provider will check your baby's heart rate at every prenatal visit. °· Second trimester ultrasound. °? This ultrasound checks your baby's development. It also may show your baby's gender. °What should I do if I have concerns about my baby's development? °Always talk with your health care provider about any concerns that you may have about your pregnancy and your baby. °Summary °· A pregnancy usually lasts 280 days, or about 40 weeks. Pregnancy is divided into three periods of growth, also called trimesters. °· Your health care provider will monitor your baby's growth and development throughout your pregnancy. °· Follow your health care provider's recommendations about taking prenatal vitamins and medicines during  your pregnancy. °· Talk with your health care provider if you have any concerns about your pregnancy or your developing baby. °This information is not intended to replace advice given to you by your health care provider. Make sure you discuss any questions you have with your health care provider. °Document Revised: 01/01/2019 Document Reviewed: 07/24/2017 °Elsevier Patient Education © 2020 [employer] ° °Safe Medications in Pregnancy  ° °Acne: °Benzoyl Peroxide °Salicylic Acid ° °Backache/Headache: °Tylenol: 2 regular strength every 4 hours OR °             2 Extra strength every 6 hours ° °Colds/Coughs/Allergies: °Benadryl (alcohol free) 25 mg every 6 hours as needed °Breath right strips °Claritin °Cepacol throat lozenges °Chloraseptic throat spray °Cold-Eeze- up to three times per day °Cough drops, alcohol free °Flonase (by prescription only) °Guaifenesin °Mucinex °Robitussin DM (plain only, alcohol free) °Saline nasal spray/drops °Sudafed (pseudoephedrine) & Actifed ** use only after [redacted] weeks gestation and if you do not have high blood pressure °Tylenol °Vicks Vaporub °Zinc lozenges °Zyrtec  ° °Constipation: °Colace °Ducolax suppositories °  Fleet enema °Glycerin suppositories °Metamucil °Milk of magnesia °Miralax °Senokot °Smooth move tea ° °Diarrhea: °Kaopectate °Imodium A-D ° °*NO pepto Bismol ° °Hemorrhoids: °Anusol °Anusol HC °Preparation H °Tucks ° °Indigestion: °Tums °Maalox °Mylanta °Zantac  °Pepcid ° °Insomnia: °Benadryl (alcohol free) 25mg every 6 hours as needed °Tylenol PM °Unisom, no Gelcaps ° °Leg Cramps: °Tums °MagGel ° °Nausea/Vomiting:  °Bonine °Dramamine °Emetrol °Ginger extract °Sea bands °Meclizine  °Nausea medication to take during pregnancy:  °Unisom (doxylamine succinate 25 mg tablets) Take one tablet daily at bedtime. If symptoms are not adequately controlled, the dose can be increased to a maximum recommended dose of two tablets daily (1/2 tablet in the morning, 1/2 tablet mid-afternoon  and one at bedtime). °Vitamin B6 100mg tablets. Take one tablet twice a day (up to 200 mg per day). ° °Skin Rashes: °Aveeno products °Benadryl cream or 25mg every 6 hours as needed °Calamine Lotion °1% cortisone cream ° °Yeast infection: °Gyne-lotrimin 7 °Monistat 7 ° ° °**If taking multiple medications, please check labels to avoid duplicating the same active ingredients °**take medication as directed on the label °** Do not exceed 4000 mg of tylenol in 24 hours °**Do not take medications that contain aspirin or ibuprofen ° ° ° ° ° °

## 2020-09-09 NOTE — MAU Note (Signed)
Patient here for abdominal pain since last Tuesday.  Denies VB.  LMP unknown.

## 2020-09-09 NOTE — MAU Provider Note (Signed)
History     CSN: [id number]  Arrival date and time: 09/09/20 1032   None     Chief Complaint  Patient presents with  . Abdominal Pain   HPI Rebecca Salazar is a 24 y.o. G2P1001 in early pregnancy who presents to MAU with chief complaint of lower abdominal pain. This is a new problem, onset Tuesday 09/06/2020. Patient states her pain waxes and wanes, but at its maximum is 7/10. She denies aggravating or alleviating factors. She has not taken medication or tried other treatments for this complaint. She denies vaginal bleeding, abnormal vaginal discharge, urinary symptoms, fever or recent illness.  Patient is accompanied by her young daughter. She states she has brief child care to allow for ultrasound but is otherwise unable to secure child care.    OB History    Gravida  2   Para  1   Term  1   Preterm  0   AB  0   Living  1     SAB  0   IAB  0   Ectopic  0   Multiple  0   Live Births  1           No past medical history on file.  No past surgical history on file.  Family History  Problem Relation Age of Onset  . Hypertension Father     Social History   Tobacco Use  . Smoking status: Current Every Day Smoker    Packs/day: 0.50    Types: Cigarettes  . Smokeless tobacco: Never Used  Substance Use Topics  . Alcohol use: Yes  . Drug use: Yes    Types: Marijuana    Allergies: No Known Allergies  Medications Prior to Admission  Medication Sig Dispense Refill Last Dose  . acyclovir (ZOVIRAX) 400 MG tablet      . cephALEXin (KEFLEX) 500 MG capsule Take 1 capsule (500 mg total) by mouth 4 (four) times daily. (Patient not taking: No sig reported) 12 capsule 0   . cyclobenzaprine (FLEXERIL) 10 MG tablet Take 1 tablet (10 mg total) by mouth 2 (two) times daily as needed for muscle spasms. (Patient not taking: No sig reported) 20 tablet 0   . ibuprofen (ADVIL) 600 MG tablet Take 1 tablet (600 mg total) by mouth every 6 (six) hours as needed.  30 tablet 0   . metroNIDAZOLE (FLAGYL) 500 MG tablet Take 1 tablet (500 mg total) by mouth 2 (two) times daily. (Patient not taking: No sig reported) 14 tablet 0   . metroNIDAZOLE (METROGEL VAGINAL) 0.75 % vaginal gel Place 1 Applicatorful vaginally 2 (two) times daily. (Patient not taking: No sig reported) 70 g 0   . mupirocin cream (BACTROBAN) 2 % Apply 1 application topically 2 (two) times daily. (Patient not taking: No sig reported) 15 g 0     Review of Systems  Gastrointestinal: Positive for abdominal pain.  All other systems reviewed and are negative.  Physical Exam   Blood pressure 123/76, pulse 94, temperature 98.6 F (37 C), resp. rate 17, last menstrual period 05/29/2020.  Physical Exam Vitals and nursing note reviewed. Exam conducted with a chaperone present.  Constitutional:      General: She is not in acute distress.    Appearance: She is not ill-appearing or toxic-appearing.  Cardiovascular:     Rate and Rhythm: Normal rate.     Heart sounds: Normal heart sounds.  Pulmonary:     Effort: Pulmonary effort is normal.  Breath sounds: Normal breath sounds.  Abdominal:     General: Bowel sounds are normal.     Tenderness: There is no abdominal tenderness.  Skin:    General: Skin is warm and dry.     Capillary Refill: Capillary refill takes less than 2 seconds.  Neurological:     Mental Status: She is alert and oriented to person, place, and time.     MAU Course  Procedures  Orders Placed This Encounter  Procedures  . Wet prep, genital  . US OB LESS THAN 14 WEEKS WITH OB TRANSVAGINAL  . CBC  . Comprehensive metabolic panel  . hCG, quantitative, pregnancy  . Urinalysis, Routine w reflex microscopic Urine, Clean Catch  . Diet NPO time specified   Patient Vitals for the past 24 hrs:  BP Temp Pulse Resp  09/09/20 1156 123/76 98.6 F (37 C) 94 17   Results for orders placed or performed during the hospital encounter of 09/09/20 (from the past 24 hour(s))   CBC     Status: Abnormal   Collection Time: 09/09/20 11:41 AM  Result Value Ref Range   WBC 12.7 (H) 4.0 - 10.5 K/uL   RBC 4.34 3.87 - 5.11 MIL/uL   Hemoglobin 11.2 (L) 12.0 - 15.0 g/dL   HCT 40.9 (L) 73.5 - 32.9 %   MCV 77.9 (L) 80.0 - 100.0 fL   MCH 25.8 (L) 26.0 - 34.0 pg   MCHC 33.1 30.0 - 36.0 g/dL   RDW 92.4 (H) 26.8 - 34.1 %   Platelets 427 (H) 150 - 400 K/uL   nRBC 0.0 0.0 - 0.2 %  Comprehensive metabolic panel     Status: Abnormal   Collection Time: 09/09/20 11:41 AM  Result Value Ref Range   Sodium 135 135 - 145 mmol/L   Potassium 3.8 3.5 - 5.1 mmol/L   Chloride 103 98 - 111 mmol/L   CO2 23 22 - 32 mmol/L   Glucose, Bld 100 (H) 70 - 99 mg/dL   BUN 6 6 - 20 mg/dL   Creatinine, Ser 9.62 0.44 - 1.00 mg/dL   Calcium 9.1 8.9 - 22.9 mg/dL   Total Protein 7.5 6.5 - 8.1 g/dL   Albumin 3.7 3.5 - 5.0 g/dL   AST 20 15 - 41 U/L   ALT 14 0 - 44 U/L   Alkaline Phosphatase 66 38 - 126 U/L   Total Bilirubin 0.6 0.3 - 1.2 mg/dL   GFR, Estimated >79 >89 mL/min   Anion gap 9 5 - 15  hCG, quantitative, pregnancy     Status: Abnormal   Collection Time: 09/09/20 11:41 AM  Result Value Ref Range   hCG, Beta Chain, Quant, S 32,871 (H) <5 mIU/mL  Wet prep, genital     Status: Abnormal   Collection Time: 09/09/20 12:48 PM  Result Value Ref Range   Yeast Wet Prep HPF POC NONE SEEN NONE SEEN   Trich, Wet Prep NONE SEEN NONE SEEN   Clue Cells Wet Prep HPF POC NONE SEEN NONE SEEN   WBC, Wet Prep HPF POC MODERATE (A) NONE SEEN   Sperm NONE SEEN   Urinalysis, Routine w reflex microscopic     Status: Abnormal   Collection Time: 09/09/20 12:48 PM  Result Value Ref Range   Color, Urine YELLOW YELLOW   APPearance HAZY (A) CLEAR   Specific Gravity, Urine 1.017 1.005 - 1.030   pH 6.0 5.0 - 8.0   Glucose, UA NEGATIVE NEGATIVE mg/dL   Hgb  urine dipstick NEGATIVE NEGATIVE   Bilirubin Urine NEGATIVE NEGATIVE   Ketones, ur NEGATIVE NEGATIVE mg/dL   Protein, ur NEGATIVE NEGATIVE mg/dL    Nitrite NEGATIVE NEGATIVE   Leukocytes,Ua NEGATIVE NEGATIVE   US OB LESS THAN 14 WEEKS WITH OB TRANSVAGINAL  Result Date: 09/09/2020 CLINICAL DATA:  Pregnant patient abdominal cramping. EXAM: OBSTETRIC <14 WK [place] AND TRANSVAGINAL OB US TECHNIQUE: Both transabdominal and transvaginal ultrasound examinations were performed for complete evaluation of the gestation as well as the maternal uterus, adnexal regions, and pelvic cul-de-sac. Transvaginal technique was performed to assess early pregnancy. COMPARISON:  None. FINDINGS: Intrauterine gestational sac: Single. Yolk sac:  Not visualized. Embryo:  Visualized. Cardiac Activity: Detected. Heart Rate: 135 bpm CRL: 4 mm   5 w   6 d                  [place] EDC: 05/05/2021. Subchorionic hemorrhage:  None visualized. Maternal uterus/adnexae: A simple right ovarian cyst measures 2.9 cm in diameter. A second small cystic lesion measuring 2.1 cm adjacent to the right ovary may be a paraovarian cyst. IMPRESSION: Single living intrauterine pregnancy.  No acute abnormality. Electronically Signed   By: Drusilla Kanner M.D.   On: 09/09/2020 14:06   Meds ordered this encounter  Medications  . Prenatal Vit-Fe Fumarate-FA (PREPLUS) 27-1 MG TABS    Sig: Take 1 tablet by mouth daily.    Dispense:  30 tablet    Refill:  13    Order Specific Question:   Supervising Provider    Answer:   Warden Fillers [[id number]]    Assessment and Plan  --24 y.o. G2P1001 with SIUP at 5w 6d --No acute concerns --Discharge home in stable condition with first trimester precautions  Calvert Cantor, CNM 09/09/2020, 3:40 PM

## 2020-09-10 ENCOUNTER — Telehealth (HOSPITAL_BASED_OUTPATIENT_CLINIC_OR_DEPARTMENT_OTHER): Payer: Self-pay | Admitting: Emergency Medicine

## 2020-09-10 NOTE — Telephone Encounter (Signed)
Post ED Visit - Positive Culture Follow-up: Successful Patient Follow-Up  Culture assessed and recommendations reviewed by:  []  , Pharm.D. []  Enzo Bi, Pharm.D., BCPS AQ-ID []  , Pharm.D., BCPS []  Celedonio Miyamoto, .D., BCPS []  [place], .D., BCPS, AAHIVP []  Georgina Pillion, Pharm.D., BCPS, AAHIVP []  [address], PharmD, BCPS []  , PharmD, BCPS []  [place], PharmD, BCPS [x]  [address], PharmD  Positive urine culture  [x]  Patient discharged without antimicrobial prescription and treatment is now indicated []  Organism is resistant to prescribed ED discharge antimicrobial []  Patient with positive blood cultures  Changes discussed with ED provider: PA New antibiotic prescription: Nitrofurantoin 100 mg PO BID x five days Called to [facility] [phone]  Contacted patient, date 09/10/2020, time 1430   Rebecca Salazar 09/10/2020, 5:39 PM

## 2020-09-12 LAB — GC/CHLAMYDIA PROBE AMP (~~LOC~~) NOT AT ARMC
Chlamydia: NEGATIVE
Comment: NEGATIVE
Comment: NORMAL
Neisseria Gonorrhea: NEGATIVE

## 2021-04-03 ENCOUNTER — Inpatient Hospital Stay (HOSPITAL_COMMUNITY)
Admission: AD | Admit: 2021-04-03 | Discharge: 2021-04-03 | Disposition: A | Discharge status: Home / Self Care | Payer: 59 | Attending: Obstetrics and Gynecology | Admitting: Obstetrics and Gynecology

## 2021-04-03 ENCOUNTER — Encounter (HOSPITAL_COMMUNITY): Payer: Self-pay | Admitting: Family Medicine

## 2021-04-03 DIAGNOSIS — R110 Nausea: Secondary | ICD-10-CM | POA: Diagnosis not present

## 2021-04-03 DIAGNOSIS — Z3202 Encounter for pregnancy test, result negative: Secondary | ICD-10-CM

## 2021-04-03 DIAGNOSIS — Z3490 Encounter for supervision of normal pregnancy, unspecified, unspecified trimester: Secondary | ICD-10-CM

## 2021-04-03 LAB — POCT PREGNANCY, URINE: Preg Test, Ur: NEGATIVE

## 2021-04-03 LAB — HCG, QUANTITATIVE, PREGNANCY: hCG, Beta Chain, Quant, S: 8 m[IU]/mL — ABNORMAL HIGH (ref <5)

## 2021-04-03 NOTE — MAU Provider Note (Addendum)
Event Date/Time  First Provider Initiated Contact with Patient 04/03/21 1948     S [name] is a 25 y.o. G2P1011 patient who presents to MAU today with complaint nausea, breast tenderness and foot pain in the setting of multiple positive home pregnancy tests. She denies vomiting, vaginal bleeding, SOB, dysuria, fever.  She endorses abdominal cramping to Triage RN but denies bleeding or cramping on CNM MSE.  O BP 135/79 (BP Location: Right Arm)   Pulse (!) 109   Temp 98.9 F (37.2 C) (Oral)   Resp 20   Ht 5\' 9"  (1.753 m)   Wt 93.8 kg   LMP 03/05/2021   BMI 30.55 kg/m     Physical Exam Vitals and nursing note reviewed. Exam conducted with a chaperone present.  Cardiovascular:     Rate and Rhythm: Normal rate.     Pulses: Normal pulses.  Pulmonary:     Effort: Pulmonary effort is normal.     Breath sounds: Normal breath sounds.  Abdominal:     General: Abdomen is flat.     Tenderness: There is no abdominal tenderness.  Skin:    Capillary Refill: Capillary refill takes less than 2 seconds.  Neurological:     Mental Status: She is alert and oriented to person, place, and time.  Psychiatric:        Mood and Affect: Mood normal.        Behavior: Behavior normal.        Thought Content: Thought content normal.        Judgment: Judgment normal.    Assessment: Medical screening exam complete Positive home pregnancy test x 3 per patient Negative urine pregnancy test in MAU Patient offered quant hCG per MAU policy.   Orders Placed This Encounter  Procedures   hCG, quantitative, pregnancy   Pregnancy, urine POC   Report given to A. 05/05/2021, MD who assumes care of patient at this time.   Barb Merino, MSN, CNM Certified Nurse Midwife, [facility] for [facility] 04/03/21 8:36 PM   Beta-hcg level 8. Discussed possibility of early pregnancy and inability to visualize on ultrasound at this time. Pt  reassured with positive beta-hcg level given multiple positive home pregnancy tests.  Plan: Discharge home with plan to follow-up in clinic for initial prenatal visit (clinic contact information provided). Pt declined follow-up appointment for repeat beta-hcg in 48 hours given no concerning symptoms at this time. Provided strict return precautions for severe abdominal pain, vaginal bleeding, inability to tolerate po intake or other concerns.  06/04/21, MD OB Fellow, Faculty [facility] 04/04/2021 5:26 AM

## 2021-04-03 NOTE — MAU Note (Addendum)
PT SAYS SHE HAD SAB ON 10-2020 - AT [redacted] WEEKS GESTATION.   LMP -03-05-2021- NAUSEA- 2 WEEKS AGO. HPT  WAS POSITIVE YESTERDAY . NO VOMITING - NEEDS MEDS HAD LOWER ABD CRAMPS AT WORK TODAY

## 2021-04-05 ENCOUNTER — Other Ambulatory Visit: Payer: Self-pay

## 2021-04-05 ENCOUNTER — Encounter (HOSPITAL_COMMUNITY): Payer: Self-pay | Admitting: Obstetrics and Gynecology

## 2021-04-05 ENCOUNTER — Inpatient Hospital Stay (HOSPITAL_COMMUNITY)
Admission: AD | Admit: 2021-04-05 | Discharge: 2021-04-05 | Disposition: A | Discharge status: Home / Self Care | Payer: 59 | Attending: Obstetrics and Gynecology | Admitting: Obstetrics and Gynecology

## 2021-04-05 DIAGNOSIS — O99331 Smoking (tobacco) complicating pregnancy, first trimester: Secondary | ICD-10-CM | POA: Diagnosis not present

## 2021-04-05 DIAGNOSIS — F17210 Nicotine dependence, cigarettes, uncomplicated: Secondary | ICD-10-CM | POA: Insufficient documentation

## 2021-04-05 DIAGNOSIS — O034 Incomplete spontaneous abortion without complication: Secondary | ICD-10-CM | POA: Diagnosis not present

## 2021-04-05 DIAGNOSIS — Z3A00 Weeks of gestation of pregnancy not specified: Secondary | ICD-10-CM | POA: Insufficient documentation

## 2021-04-05 LAB — HCG, QUANTITATIVE, PREGNANCY: hCG, Beta Chain, Quant, S: 7 m[IU]/mL — ABNORMAL HIGH (ref <5)

## 2021-04-05 NOTE — MAU Note (Signed)
Went to the BR about an hour ago and noticed pinkish red on tissue when wiped. Cramping pain in lower abdomen.

## 2021-04-05 NOTE — MAU Provider Note (Signed)
Chief Complaint: Vaginal Bleeding   None       SUBJECTIVE HPI: Rebecca Salazar is a 25 y.o. [id number] at Unknown by LMP who presents to maternity admissions reporting pink/red blood on tissue with some cramping.  Was seen here 2 days ago for pregnancy confirmation.  UPT here was negative, so HCG was done which was "8":  She declined further followup HCG levels since she was feeling fine.  She denies urinary symptoms, h/a, dizziness, n/v, or fever/chills.    Vaginal Bleeding The patient's primary symptoms include pelvic pain and vaginal bleeding. The patient's pertinent negatives include no genital itching, genital lesions or genital odor. This is a new problem. The current episode started today. The problem occurs intermittently. The problem has been unchanged. The pain is mild. She is pregnant. Pertinent negatives include no fever, nausea or vomiting. The vaginal discharge was bloody. The vaginal bleeding is spotting. She has not been passing clots. She has not been passing tissue. Nothing aggravates the symptoms. She has tried nothing for the symptoms.   RN Note: Went to the BR about an hour ago and noticed pinkish red on tissue when wiped. Cramping pain in lower abdomen  No past medical history on file. No past surgical history on file. Social History   Socioeconomic History   Marital status: Significant Other    Spouse name: Not on file   Number of children: Not on file   Years of education: Not on file   Highest education level: Not on file  Occupational History   Not on file  Tobacco Use   Smoking status: Every Day    Packs/day: 0.50    Pack years: 0.00    Types: Cigarettes   Smokeless tobacco: Never  Substance and Sexual Activity   Alcohol use: Yes   Drug use: Yes    Types: Marijuana   Sexual activity: Never    Birth control/protection: None  Other Topics Concern   Not on file  Social History Narrative   ** Merged History Encounter **       Social  Determinants of Health   Financial Resource Strain: Not on file  Food Insecurity: Not on file  Transportation Needs: Not on file  Physical Activity: Not on file  Stress: Not on file  Social Connections: Not on file  Intimate Partner Violence: Not on file   No current facility-administered medications on file prior to encounter.   Current Outpatient Medications on File Prior to Encounter  Medication Sig Dispense Refill   acyclovir (ZOVIRAX) 400 MG tablet      Prenatal Vit-Fe Fumarate-FA (PREPLUS) 27-1 MG TABS Take 1 tablet by mouth daily. 30 tablet 13   No Known Allergies  I have reviewed patient's Past Medical Hx, Surgical Hx, Family Hx, Social Hx, medications and allergies.   ROS:  Review of Systems  Constitutional:  Negative for fever.  Gastrointestinal:  Negative for nausea and vomiting.  Genitourinary:  Positive for pelvic pain and vaginal bleeding.  Review of Systems  Other systems negative   Physical Exam  Physical Exam Patient Vitals for the past 24 hrs:  BP Temp Pulse Resp Height Weight  04/05/21 0348 118/88 -- 78 -- -- --  04/05/21 0347 -- 97.7 F (36.5 C) -- 18 5\' 9"  (1.753 m) 94.3 kg   Constitutional: Well-developed, well-nourished female in no acute distress.  Cardiovascular: normal rate Respiratory: normal effort GI: Abd soft, non-tender.  MS: Extremities nontender, no edema, normal ROM Neurologic: Alert and oriented x 4.  GU: Neg CVAT.  PELVIC EXAM: deferred due to bleeding being very light.   LAB RESULTS Results for orders placed or performed during the hospital encounter of 04/05/21 (from the past 24 hour(s))  hCG, quantitative, pregnancy     Status: Abnormal   Collection Time: 04/05/21  3:26 AM  Result Value Ref Range   hCG, Beta Chain, Quant, S 7 (H) <5 mIU/mL    Ref. Range 04/03/2021 20:01  HCG, Beta Chain, Quant, S Latest Ref Range: <5 mIU/mL 8 (H)     IMAGING No results found.  MAU Management/MDM: Ordered followup HCG level, which I  suspect will be lower, since level was so low 2 days ago.   Will check baseline Ultrasound to rule out ectopic.  Discussed results which indicate inevitable abortion.  Discussed the pregnancy probably was abnormal from the beginning and should have shown doubling of HCG if progressing normally.  Patient wishes to try again as soon as possible  ASSESSMENT Pregnancy at Unknown [place] Decreasing HCG levels Inevitable spontaneous Abortion  PLAN Discharge home Bleeding/SAB precautions  Pt stable at time of discharge. Plans follow up at [facility] Encouraged to return here if she develops worsening of symptoms, increase in pain, fever, or other concerning symptoms.    Wynelle Bourgeois CNM, MSN Certified Nurse-Midwife 04/05/2021  4:04 AM

## 2021-07-26 DIAGNOSIS — B9689 Other specified bacterial agents as the cause of diseases classified elsewhere: Secondary | ICD-10-CM | POA: Insufficient documentation

## 2021-07-26 DIAGNOSIS — R350 Frequency of micturition: Secondary | ICD-10-CM | POA: Insufficient documentation

## 2021-08-13 ENCOUNTER — Inpatient Hospital Stay (HOSPITAL_COMMUNITY)
Admission: AD | Admit: 2021-08-13 | Discharge: 2021-08-13 | Disposition: A | Discharge status: Home / Self Care | Payer: 59 | Attending: Obstetrics & Gynecology | Admitting: Obstetrics & Gynecology

## 2021-08-13 ENCOUNTER — Encounter (HOSPITAL_COMMUNITY): Payer: Self-pay | Admitting: Obstetrics & Gynecology

## 2021-08-13 ENCOUNTER — Other Ambulatory Visit: Payer: Self-pay

## 2021-08-13 DIAGNOSIS — Z3201 Encounter for pregnancy test, result positive: Secondary | ICD-10-CM | POA: Insufficient documentation

## 2021-08-13 DIAGNOSIS — Z3A00 Weeks of gestation of pregnancy not specified: Secondary | ICD-10-CM | POA: Diagnosis not present

## 2021-08-13 DIAGNOSIS — R431 Parosmia: Secondary | ICD-10-CM | POA: Diagnosis not present

## 2021-08-13 DIAGNOSIS — O26891 Other specified pregnancy related conditions, first trimester: Secondary | ICD-10-CM | POA: Diagnosis not present

## 2021-08-13 DIAGNOSIS — R110 Nausea: Secondary | ICD-10-CM | POA: Diagnosis not present

## 2021-08-13 LAB — POCT PREGNANCY, URINE: Preg Test, Ur: POSITIVE — AB

## 2021-08-13 NOTE — MAU Note (Signed)
Feels extremely sick, started last wk.  Period is very late. +HPT. Wants to make sure everything is ok, had been drinking.  Had OB/GYN appt 11/1, neg test then.  Period was due 11/10. No pain or bleeding.

## 2021-08-13 NOTE — MAU Provider Note (Signed)
Event Date/Time   First Provider Initiated Contact with Patient 08/13/21 1615      S Rebecca Salazar is a 25 y.o. [id number] patient who presents to MAU today with complaint of heightened sense of smell, nausea and wanting to know if she is pregnant. Patient states she was seen by her OB/GYN on 07/25/2021 and her UPT was negative, but also states her period was not due until 08/03/2021, so she had not missed her period yet. Patient denies vomiting and states she is able to eat and drink normally.   O BP 133/83 (BP Location: Right Arm)   Pulse 92   Temp 98.8 F (37.1 C) (Oral)   Resp 16   Ht 5\' 9"  (1.753 m)   Wt 93.5 kg   LMP 07/03/2021   SpO2 100%   Breastfeeding Unknown   BMI 30.45 kg/m   Patient Vitals for the past 24 hrs:  BP Temp Temp src Pulse Resp SpO2 Height Weight  08/13/21 1600 133/83 98.8 F (37.1 C) Oral 92 16 100 % 5\' 9"  (1.753 m) 93.5 kg   Physical Exam Vitals and nursing note reviewed.  Constitutional:      General: She is not in acute distress.    Appearance: Normal appearance. She is not ill-appearing, toxic-appearing or diaphoretic.  HENT:     Head: Normocephalic and atraumatic.  Pulmonary:     Effort: Pulmonary effort is normal.  Neurological:     Mental Status: She is alert and oriented to person, place, and time.  Psychiatric:        Mood and Affect: Mood normal.        Behavior: Behavior normal.        Thought Content: Thought content normal.        Judgment: Judgment normal.   A Medical screening exam complete +UPT Nausea  P Discharge from MAU in stable condition Safe meds in pregnancy list given with focus on anti-emetics, specifically Unisom and B6 Pt states she already has OB and will call [facility] tomorrow to schedule appt. Warning signs for worsening condition that would warrant emergency follow-up discussed Patient may return to MAU as needed   Adin Lariccia, 08/15/21, NP 08/13/2021 4:29 PM

## 2021-08-13 NOTE — Discharge Instructions (Signed)

## 2021-08-17 ENCOUNTER — Inpatient Hospital Stay (HOSPITAL_COMMUNITY): Payer: 59

## 2021-08-17 ENCOUNTER — Inpatient Hospital Stay (HOSPITAL_COMMUNITY)
Admission: AD | Admit: 2021-08-17 | Discharge: 2021-08-17 | Disposition: A | Discharge status: Home / Self Care | Payer: 59 | Attending: Obstetrics & Gynecology | Admitting: Obstetrics & Gynecology

## 2021-08-17 ENCOUNTER — Encounter (HOSPITAL_COMMUNITY): Payer: Self-pay | Admitting: Obstetrics & Gynecology

## 2021-08-17 ENCOUNTER — Other Ambulatory Visit: Payer: Self-pay

## 2021-08-17 DIAGNOSIS — O99891 Other specified diseases and conditions complicating pregnancy: Secondary | ICD-10-CM | POA: Diagnosis not present

## 2021-08-17 DIAGNOSIS — O26891 Other specified pregnancy related conditions, first trimester: Secondary | ICD-10-CM | POA: Diagnosis not present

## 2021-08-17 DIAGNOSIS — Z3A01 Less than 8 weeks gestation of pregnancy: Secondary | ICD-10-CM | POA: Insufficient documentation

## 2021-08-17 DIAGNOSIS — O418X10 Other specified disorders of amniotic fluid and membranes, first trimester, not applicable or unspecified: Secondary | ICD-10-CM | POA: Diagnosis not present

## 2021-08-17 DIAGNOSIS — O468X1 Other antepartum hemorrhage, first trimester: Secondary | ICD-10-CM | POA: Diagnosis not present

## 2021-08-17 DIAGNOSIS — O208 Other hemorrhage in early pregnancy: Secondary | ICD-10-CM | POA: Diagnosis present

## 2021-08-17 DIAGNOSIS — R109 Unspecified abdominal pain: Secondary | ICD-10-CM | POA: Diagnosis not present

## 2021-08-17 DIAGNOSIS — Z3491 Encounter for supervision of normal pregnancy, unspecified, first trimester: Secondary | ICD-10-CM

## 2021-08-17 LAB — WET PREP, GENITAL
Clue Cells Wet Prep HPF POC: NONE SEEN
Sperm: NONE SEEN
Trich, Wet Prep: NONE SEEN
WBC, Wet Prep HPF POC: <10 (ref <10)
Yeast Wet Prep HPF POC: NONE SEEN

## 2021-08-17 LAB — CBC
HCT: 34 % — ABNORMAL LOW (ref 36.0–46.0)
Hemoglobin: 10.6 g/dL — ABNORMAL LOW (ref 12.0–15.0)
MCH: 23.4 pg — ABNORMAL LOW (ref 26.0–34.0)
MCHC: 31.2 g/dL (ref 30.0–36.0)
MCV: 75.1 fL — ABNORMAL LOW (ref 80.0–100.0)
Platelets: 415 10*3/uL — ABNORMAL HIGH (ref 150–400)
RBC: 4.53 MIL/uL (ref 3.87–5.11)
RDW: 19.9 % — ABNORMAL HIGH (ref 11.5–15.5)
WBC: 14 10*3/uL — ABNORMAL HIGH (ref 4.0–10.5)
nRBC: 0 % (ref 0.0–0.2)

## 2021-08-17 LAB — URINALYSIS, ROUTINE W REFLEX MICROSCOPIC
Bilirubin Urine: NEGATIVE
Glucose, UA: NEGATIVE mg/dL
Hgb urine dipstick: NEGATIVE
Ketones, ur: NEGATIVE mg/dL
Leukocytes,Ua: NEGATIVE
Nitrite: NEGATIVE
Protein, ur: NEGATIVE mg/dL
Specific Gravity, Urine: 1.024 (ref 1.005–1.030)
pH: 6 (ref 5.0–8.0)

## 2021-08-17 LAB — HCG, QUANTITATIVE, PREGNANCY: hCG, Beta Chain, Quant, S: 46282 m[IU]/mL — ABNORMAL HIGH (ref <5)

## 2021-08-17 NOTE — MAU Provider Note (Signed)
Chief Complaint: Abdominal Pain   Event Date/Time   First Provider Initiated Contact with Patient 08/17/21 1030      SUBJECTIVE HPI: Rebecca Salazar is a 25 y.o. [id number] at [redacted]w[redacted]d by LMP who presents to maternity admissions reporting onset of painful cramping this morning at 8 am. There are no other associated symptoms. She denies vaginal bleeding, vaginal itching/burning, urinary symptoms, h/a, dizziness, n/v, or fever/chills.     Location: lower abdomen Quality: cramping Severity: 6/10 on pain scale Duration: 2 hours Timing: intermittent Modifying factors: none Associated signs and symptoms: none  HPI  History reviewed. No pertinent past medical history. History reviewed. No pertinent surgical history. Social History   Socioeconomic History   Marital status: Significant Other    Spouse name: Not on file   Number of children: Not on file   Years of education: Not on file   Highest education level: Not on file  Occupational History   Not on file  Tobacco Use   Smoking status: Former    Packs/day: 0.50    Types: Cigarettes   Smokeless tobacco: Never  Substance and Sexual Activity   Alcohol use: Yes   Drug use: Not Currently    Types: Marijuana   Sexual activity: Never    Birth control/protection: None  Other Topics Concern   Not on file  Social History Narrative   ** Merged History Encounter **       Social Determinants of Health   Financial Resource Strain: Not on file  Food Insecurity: Not on file  Transportation Needs: Not on file  Physical Activity: Not on file  Stress: Not on file  Social Connections: Not on file  Intimate Partner Violence: Not on file   No current facility-administered medications on file prior to encounter.   Current Outpatient Medications on File Prior to Encounter  Medication Sig Dispense Refill   acyclovir (ZOVIRAX) 400 MG tablet      Prenatal Vit-Fe Fumarate-FA (PREPLUS) 27-1 MG TABS Take 1 tablet by mouth daily. 30  tablet 13   No Known Allergies  ROS:  Review of Systems  Constitutional:  Negative for chills, fatigue and fever.  Respiratory:  Negative for shortness of breath.   Cardiovascular:  Negative for chest pain.  Gastrointestinal:  Positive for abdominal pain. Negative for nausea and vomiting.  Genitourinary:  Negative for difficulty urinating, dysuria, flank pain, pelvic pain, vaginal bleeding, vaginal discharge and vaginal pain.  Musculoskeletal:  Negative for back pain.  Neurological:  Negative for dizziness and headaches.  Psychiatric/Behavioral: Negative.      I have reviewed patient's Past Medical Hx, Surgical Hx, Family Hx, Social Hx, medications and allergies.   Physical Exam  Patient Vitals for the past 24 hrs:  BP Temp Temp src Pulse Resp SpO2  08/17/21 1236 (!) 125/94 -- -- 67 -- --  08/17/21 0952 133/85 98.7 F (37.1 C) Oral 65 18 99 %   Constitutional: Well-developed, well-nourished female in no acute distress.  Cardiovascular: normal rate Respiratory: normal effort GI: Abd soft, non-tender. Pos BS x 4 MS: Extremities nontender, no edema, normal ROM Neurologic: Alert and oriented x 4.  GU: Neg CVAT.  PELVIC EXAM: vaginal cultures collected by blind swab    LAB RESULTS Results for orders placed or performed during the hospital encounter of 08/17/21 (from the past 24 hour(s))  Urinalysis, Routine w reflex microscopic Urine, Clean Catch     Status: Abnormal   Collection Time: 08/17/21  9:40 AM  Result Value Ref Range  Color, Urine YELLOW YELLOW   APPearance HAZY (A) CLEAR   Specific Gravity, Urine 1.024 1.005 - 1.030   pH 6.0 5.0 - 8.0   Glucose, UA NEGATIVE NEGATIVE mg/dL   Hgb urine dipstick NEGATIVE NEGATIVE   Bilirubin Urine NEGATIVE NEGATIVE   Ketones, ur NEGATIVE NEGATIVE mg/dL   Protein, ur NEGATIVE NEGATIVE mg/dL   Nitrite NEGATIVE NEGATIVE   Leukocytes,Ua NEGATIVE NEGATIVE  CBC     Status: Abnormal   Collection Time: 08/17/21 10:27 AM  Result  Value Ref Range   WBC 14.0 (H) 4.0 - 10.5 K/uL   RBC 4.53 3.87 - 5.11 MIL/uL   Hemoglobin 10.6 (L) 12.0 - 15.0 g/dL   HCT 27.7 (L) 82.4 - 23.5 %   MCV 75.1 (L) 80.0 - 100.0 fL   MCH 23.4 (L) 26.0 - 34.0 pg   MCHC 31.2 30.0 - 36.0 g/dL   RDW 36.1 (H) 44.3 - 15.4 %   Platelets 415 (H) 150 - 400 K/uL   nRBC 0.0 0.0 - 0.2 %  hCG, quantitative, pregnancy     Status: Abnormal   Collection Time: 08/17/21 10:27 AM  Result Value Ref Range   hCG, Beta Chain, Quant, S 46,282 (H) <5 mIU/mL  Wet prep, genital     Status: None   Collection Time: 08/17/21 10:40 AM  Result Value Ref Range   Yeast Wet Prep HPF POC NONE SEEN NONE SEEN   Trich, Wet Prep NONE SEEN NONE SEEN   Clue Cells Wet Prep HPF POC NONE SEEN NONE SEEN   WBC, Wet Prep HPF POC <10 <10   Sperm NONE SEEN        IMAGING US OB Comp Less 14 Wks  Result Date: 08/17/2021 CLINICAL DATA:  A 25 year old female presents with history of cramping and the setting of positive pregnancy test. Quantitative beta HCG is pending. Patient is 6 weeks 3 days gestational age as estimated by last menstrual cycle. EXAM: OBSTETRIC <14 WK [place] AND TRANSVAGINAL OB US TECHNIQUE: Both transabdominal and transvaginal ultrasound examinations were performed for complete evaluation of the gestation as well as the maternal uterus, adnexal regions, and pelvic cul-de-sac. Transvaginal technique was performed to assess early pregnancy. COMPARISON:  More remote imaging from 2021. FINDINGS: Intrauterine gestational sac: Single Yolk sac:  Visualized Embryo:  Visualized Cardiac Activity: Visualized Heart Rate: 116 bpm CRL:  4.8 mm   6 w   1 d                  [place] EDC: 04/11/2022 Subchorionic hemorrhage:  Small Maternal uterus/adnexae: Unremarkable appearance of uterus and adnexal structures. Small corpus luteum on the LEFT. IMPRESSION: Single intrauterine gestation, 6 weeks 1 day by crown-rump length of 4.8 mm with a fetal heart rate of 116 beats per minute. Small subchorionic  hemorrhage along the RIGHT aspect of the gestation. Electronically Signed   By: Donzetta Kohut M.D.   On: 08/17/2021 11:54    MAU Management/MDM: Orders Placed This Encounter  Procedures   Wet prep, genital   US OB Comp Less 14 Wks   Urinalysis, Routine w reflex microscopic Urine, Clean Catch   CBC   hCG, quantitative, pregnancy   Discharge patient    No orders of the defined types were placed in this encounter.   IUP confirmed on today's [place]. Small subchorionic hemorrhage noted.  Discussed results with pt.  F/U with early prenatal care.  Return to MAU as needed for emergencies.    ASSESSMENT 1. Normal IUP (  intrauterine pregnancy) on prenatal ultrasound, first trimester   2. Abdominal pain during pregnancy in first trimester   3. Subchorionic hemorrhage of placenta in first trimester, single or unspecified fetus   4. [redacted] weeks gestation of pregnancy   5. Rh positive   PLAN Discharge home Allergies as of 08/17/2021   No Known Allergies      Medication List     TAKE these medications    acyclovir 400 MG tablet Commonly known as: ZOVIRAX   PrePLUS 27-1 MG Tabs Take 1 tablet by mouth daily.        Follow-up Information     prenatal provider of your choice Follow up.   Why: Start prenatal care as soon as possible. See list of providers.                Sharen Counter Certified Nurse-Midwife 08/17/2021  7:49 PM

## 2021-08-17 NOTE — Discharge Instructions (Signed)
Prenatal Care Providers           [facility]  [address] [phone]  [facility]   [address]  [phone]  [facility] [place]       [address] [phone]            [facility] @ [place]     [address] [phone]          [facility] [place]   [address] [phone]  [facility]  [address] [phone]     [facility]  [address]   [phone]     [facility]  Phone: [phone]  [facility]  Phone: [phone]  [facility] Phone: [phone]  [facility] Phone: [phone]  [facility] Phone: [phone]  [facility] Phone: [phone]  [facility]  Phone: [phone]  

## 2021-08-17 NOTE — MAU Note (Signed)
Rebecca Salazar is a 25 y.o. at [redacted]w[redacted]d here in MAU reporting: cramping started this morning around 0800. Pain is intermittent and is her whole abdomen. No bleeding or abnormal discharge. Pt reports she started feeling lightheaded when she got to the hospital, has had some juice today.  Onset of complaint: today  Pain score: 7/10  Vitals:   08/17/21 0952  BP: 133/85  Pulse: 65  Resp: 18  Temp: 98.7 F (37.1 C)  SpO2: 99%     Lab orders placed from triage: ua

## 2021-08-18 LAB — GC/CHLAMYDIA PROBE AMP (~~LOC~~) NOT AT ARMC
Chlamydia: NEGATIVE
Comment: NEGATIVE
Comment: NORMAL
Neisseria Gonorrhea: NEGATIVE

## 2021-08-25 ENCOUNTER — Inpatient Hospital Stay (HOSPITAL_COMMUNITY)
Admission: AD | Admit: 2021-08-25 | Discharge: 2021-08-25 | Discharge status: Against Medical Advice | Payer: 59 | Attending: Obstetrics & Gynecology | Admitting: Obstetrics & Gynecology

## 2021-08-25 NOTE — MAU Note (Signed)
MAU Registration called to inform MAU providers that patient left to go drop her daughter off. Patient left without singing AMA form per registration. Tonye Becket, NP, made aware.

## 2021-08-26 ENCOUNTER — Other Ambulatory Visit: Payer: Self-pay

## 2021-08-26 ENCOUNTER — Inpatient Hospital Stay (HOSPITAL_COMMUNITY)
Admission: AD | Admit: 2021-08-26 | Discharge: 2021-08-26 | Disposition: A | Discharge status: Home / Self Care | Payer: 59 | Attending: Obstetrics and Gynecology | Admitting: Obstetrics and Gynecology

## 2021-08-26 ENCOUNTER — Encounter (HOSPITAL_COMMUNITY): Payer: Self-pay | Admitting: Obstetrics and Gynecology

## 2021-08-26 DIAGNOSIS — O219 Vomiting of pregnancy, unspecified: Secondary | ICD-10-CM

## 2021-08-26 DIAGNOSIS — Z3A01 Less than 8 weeks gestation of pregnancy: Secondary | ICD-10-CM

## 2021-08-26 DIAGNOSIS — Z87891 Personal history of nicotine dependence: Secondary | ICD-10-CM | POA: Insufficient documentation

## 2021-08-26 LAB — COMPREHENSIVE METABOLIC PANEL
ALT: 12 U/L (ref 0–44)
AST: 20 U/L (ref 15–41)
Albumin: 3.7 g/dL (ref 3.5–5.0)
Alkaline Phosphatase: 68 U/L (ref 38–126)
Anion gap: 9 (ref 5–15)
BUN: 7 mg/dL (ref 6–20)
CO2: 24 mmol/L (ref 22–32)
Calcium: 9.3 mg/dL (ref 8.9–10.3)
Chloride: 100 mmol/L (ref 98–111)
Creatinine, Ser: 0.62 mg/dL (ref 0.44–1.00)
GFR, Estimated: >60 mL/min (ref >60)
Glucose, Bld: 86 mg/dL (ref 70–99)
Potassium: 3.6 mmol/L (ref 3.5–5.1)
Sodium: 133 mmol/L — ABNORMAL LOW (ref 135–145)
Total Bilirubin: 0.4 mg/dL (ref 0.3–1.2)
Total Protein: 7.6 g/dL (ref 6.5–8.1)

## 2021-08-26 LAB — DIFFERENTIAL
Abs Immature Granulocytes: 0.05 10*3/uL (ref 0.00–0.07)
Basophils Absolute: 0.1 10*3/uL (ref 0.0–0.1)
Basophils Relative: 0 %
Eosinophils Absolute: 0.1 10*3/uL (ref 0.0–0.5)
Eosinophils Relative: 0 %
Immature Granulocytes: 0 %
Lymphocytes Relative: 24 %
Lymphs Abs: 4.4 10*3/uL — ABNORMAL HIGH (ref 0.7–4.0)
Monocytes Absolute: 0.9 10*3/uL (ref 0.1–1.0)
Monocytes Relative: 5 %
Neutro Abs: 13.3 10*3/uL — ABNORMAL HIGH (ref 1.7–7.7)
Neutrophils Relative %: 71 %

## 2021-08-26 LAB — URINALYSIS, ROUTINE W REFLEX MICROSCOPIC
Bilirubin Urine: NEGATIVE
Glucose, UA: NEGATIVE mg/dL
Hgb urine dipstick: NEGATIVE
Ketones, ur: NEGATIVE mg/dL
Nitrite: NEGATIVE
Protein, ur: 100 mg/dL — AB
Specific Gravity, Urine: 1.032 — ABNORMAL HIGH (ref 1.005–1.030)
pH: 8 (ref 5.0–8.0)

## 2021-08-26 LAB — CBC
HCT: 35.9 % — ABNORMAL LOW (ref 36.0–46.0)
Hemoglobin: 11.3 g/dL — ABNORMAL LOW (ref 12.0–15.0)
MCH: 24.2 pg — ABNORMAL LOW (ref 26.0–34.0)
MCHC: 31.5 g/dL (ref 30.0–36.0)
MCV: 77 fL — ABNORMAL LOW (ref 80.0–100.0)
Platelets: 408 10*3/uL — ABNORMAL HIGH (ref 150–400)
RBC: 4.66 MIL/uL (ref 3.87–5.11)
RDW: 20 % — ABNORMAL HIGH (ref 11.5–15.5)
WBC: 19 10*3/uL — ABNORMAL HIGH (ref 4.0–10.5)
nRBC: 0 % (ref 0.0–0.2)

## 2021-08-26 MED ORDER — FAMOTIDINE IN NACL 20-0.9 MG/50ML-% IV SOLN
20.0000 mg | Freq: Once | INTRAVENOUS | Status: AC
  Administered 2021-08-26: 20 mg via INTRAVENOUS
  Filled 2021-08-26: qty 50

## 2021-08-26 MED ORDER — METOCLOPRAMIDE HCL 5 MG/ML IJ SOLN
10.0000 mg | Freq: Once | INTRAMUSCULAR | Status: AC
  Administered 2021-08-26: 10 mg via INTRAVENOUS
  Filled 2021-08-26: qty 2

## 2021-08-26 MED ORDER — LACTATED RINGERS IV BOLUS
1000.0000 mL | Freq: Once | INTRAVENOUS | Status: AC
  Administered 2021-08-26: 1000 mL via INTRAVENOUS

## 2021-08-26 MED ORDER — METOCLOPRAMIDE HCL 10 MG PO TABS: 10.0000 mg | ORAL_TABLET | Freq: Four times a day (QID) | ORAL | 0 refills | Status: DC

## 2021-08-26 NOTE — MAU Provider Note (Addendum)
Chief Complaint: Emesis and Nausea   Event Date/Time   First Provider Initiated Contact with Patient 08/26/21 1915      SUBJECTIVE HPI: Rebecca Salazar is a 25 y.o. [id number] at [redacted]w[redacted]d by LMP who presents to maternity admissions reporting nausea and vomiting and unable to keep down food or fluids x 1 week. She reports vomiting x 4 in the last 24 hours. She does not have any nausea medications ordered and has not tried any treatments.   She denies vaginal bleeding, vaginal itching/burning, urinary symptoms, h/a, dizziness, n/v, or fever/chills.     HPI  History reviewed. No pertinent past medical history. History reviewed. No pertinent surgical history. Social History   Socioeconomic History   Marital status: Significant Other    Spouse name: Not on file   Number of children: Not on file   Years of education: Not on file   Highest education level: Not on file  Occupational History   Not on file  Tobacco Use   Smoking status: Former    Packs/day: 0.50    Types: Cigarettes   Smokeless tobacco: Never  Substance and Sexual Activity   Alcohol use: Yes   Drug use: Not Currently    Types: Marijuana   Sexual activity: Never    Birth control/protection: None  Other Topics Concern   Not on file  Social History Narrative   ** Merged History Encounter **       Social Determinants of Health   Financial Resource Strain: Not on file  Food Insecurity: Not on file  Transportation Needs: Not on file  Physical Activity: Not on file  Stress: Not on file  Social Connections: Not on file  Intimate Partner Violence: Not on file   No current facility-administered medications on file prior to encounter.   Current Outpatient Medications on File Prior to Encounter  Medication Sig Dispense Refill   Prenatal Vit-Fe Fumarate-FA (PREPLUS) 27-1 MG TABS Take 1 tablet by mouth daily. 30 tablet 13   acyclovir (ZOVIRAX) 400 MG tablet      No Known Allergies  ROS:  Review of Systems   Constitutional:  Negative for chills, fatigue and fever.  Respiratory:  Negative for shortness of breath.   Cardiovascular:  Negative for chest pain.  Gastrointestinal:  Positive for nausea and vomiting.  Genitourinary:  Negative for difficulty urinating, dysuria, flank pain, pelvic pain, vaginal bleeding, vaginal discharge and vaginal pain.  Neurological:  Negative for dizziness and headaches.  Psychiatric/Behavioral: Negative.      I have reviewed patient's Past Medical Hx, Surgical Hx, Family Hx, Social Hx, medications and allergies.   Physical Exam  Patient Vitals for the past 24 hrs:  BP Temp Temp src Pulse Resp SpO2  08/26/21 1859 (!) 146/93 98.5 F (36.9 C) Oral (!) 110 19 100 %   Constitutional: Well-developed, well-nourished female in no acute distress.  Cardiovascular: normal rate Respiratory: normal effort GI: Abd soft, non-tender. Pos BS x 4 MS: Extremities nontender, no edema, normal ROM Neurologic: Alert and oriented x 4.  GU: Neg CVAT.  PELVIC EXAM: Deferred   LAB RESULTS Results for orders placed or performed during the hospital encounter of 08/26/21 (from the past 24 hour(s))  Urinalysis, Routine w reflex microscopic Urine, Clean Catch     Status: Abnormal   Collection Time: 08/26/21  6:44 PM  Result Value Ref Range   Color, Urine AMBER (A) YELLOW   APPearance CLOUDY (A) CLEAR   Specific Gravity, Urine 1.032 (H) 1.005 - 1.030  pH 8.0 5.0 - 8.0   Glucose, UA NEGATIVE NEGATIVE mg/dL   Hgb urine dipstick NEGATIVE NEGATIVE   Bilirubin Urine NEGATIVE NEGATIVE   Ketones, ur NEGATIVE NEGATIVE mg/dL   Protein, ur 962 (A) NEGATIVE mg/dL   Nitrite NEGATIVE NEGATIVE   Leukocytes,Ua TRACE (A) NEGATIVE   RBC / HPF 6-10 0 - 5 RBC/hpf   WBC, UA 0-5 0 - 5 WBC/hpf   Bacteria, UA RARE (A) NONE SEEN   Squamous Epithelial / LPF 21-50 0 - 5   Mucus PRESENT    Hyaline Casts, UA PRESENT   CBC     Status: Abnormal   Collection Time: 08/26/21  7:41 PM  Result Value Ref  Range   WBC 19.0 (H) 4.0 - 10.5 K/uL   RBC 4.66 3.87 - 5.11 MIL/uL   Hemoglobin 11.3 (L) 12.0 - 15.0 g/dL   HCT 22.9 (L) 79.8 - 92.1 %   MCV 77.0 (L) 80.0 - 100.0 fL   MCH 24.2 (L) 26.0 - 34.0 pg   MCHC 31.5 30.0 - 36.0 g/dL   RDW 19.4 (H) 17.4 - 08.1 %   Platelets 408 (H) 150 - 400 K/uL   nRBC 0.0 0.0 - 0.2 %  Comprehensive metabolic panel     Status: Abnormal   Collection Time: 08/26/21  7:41 PM  Result Value Ref Range   Sodium 133 (L) 135 - 145 mmol/L   Potassium 3.6 3.5 - 5.1 mmol/L   Chloride 100 98 - 111 mmol/L   CO2 24 22 - 32 mmol/L   Glucose, Bld 86 70 - 99 mg/dL   BUN 7 6 - 20 mg/dL   Creatinine, Ser 4.48 0.44 - 1.00 mg/dL   Calcium 9.3 8.9 - 18.5 mg/dL   Total Protein 7.6 6.5 - 8.1 g/dL   Albumin 3.7 3.5 - 5.0 g/dL   AST 20 15 - 41 U/L   ALT 12 0 - 44 U/L   Alkaline Phosphatase 68 38 - 126 U/L   Total Bilirubin 0.4 0.3 - 1.2 mg/dL   GFR, Estimated >63 >14 mL/min   Anion gap 9 5 - 15       IMAGING US OB Comp Less 14 Wks  Result Date: 08/17/2021 CLINICAL DATA:  A 25 year old female presents with history of cramping and the setting of positive pregnancy test. Quantitative beta HCG is pending. Patient is 6 weeks 3 days gestational age as estimated by last menstrual cycle. EXAM: OBSTETRIC <14 WK [place] AND TRANSVAGINAL OB US TECHNIQUE: Both transabdominal and transvaginal ultrasound examinations were performed for complete evaluation of the gestation as well as the maternal uterus, adnexal regions, and pelvic cul-de-sac. Transvaginal technique was performed to assess early pregnancy. COMPARISON:  More remote imaging from 2021. FINDINGS: Intrauterine gestational sac: Single Yolk sac:  Visualized Embryo:  Visualized Cardiac Activity: Visualized Heart Rate: 116 bpm CRL:  4.8 mm   6 w   1 d                  [place] EDC: 04/11/2022 Subchorionic hemorrhage:  Small Maternal uterus/adnexae: Unremarkable appearance of uterus and adnexal structures. Small corpus luteum on the LEFT.  IMPRESSION: Single intrauterine gestation, 6 weeks 1 day by crown-rump length of 4.8 mm with a fetal heart rate of 116 beats per minute. Small subchorionic hemorrhage along the RIGHT aspect of the gestation. Electronically Signed   By: Donzetta Kohut M.D.   On: 08/17/2021 11:54    MAU Management/MDM: Orders Placed This Encounter  Procedures  Urinalysis, Routine w reflex microscopic   CBC   Comprehensive metabolic panel   Differential    Meds ordered this encounter  Medications   lactated ringers bolus 1,000 mL   metoCLOPramide (REGLAN) injection 10 mg   famotidine (PEPCID) IVPB 20 mg premix    IV fluids and Pepcid and Reglan ordered since pt driving.  Pt with reaction to IV Reglan with symptoms of skin crawling which lasted ~ 30 minutes but symptoms resolved without treatment.  Labs pending.  Report to [facility] CNM.    Sharen Counter Certified Nurse-Midwife 08/26/2021  10:04 PM    Reassessment (10:30 PM) Nurse reports patient tolerating PO Will send script for Reglan Differential pending Will discharge and patient to follow up as appropriately.  Cherre Robins MSN, CNM Advanced Practice Provider, [facility]

## 2021-08-26 NOTE — MAU Note (Signed)
Presents with c/o N/V, states she's unable to keep anything down for 1 week.  No rx's prescribed for N/V. Denies VB.

## 2021-08-29 DIAGNOSIS — R1110 Vomiting, unspecified: Secondary | ICD-10-CM | POA: Insufficient documentation

## 2021-09-19 DIAGNOSIS — E049 Nontoxic goiter, unspecified: Secondary | ICD-10-CM | POA: Insufficient documentation

## 2021-09-19 LAB — OB RESULTS CONSOLE HEPATITIS B SURFACE ANTIGEN: Hepatitis B Surface Ag: NEGATIVE

## 2021-09-19 LAB — OB RESULTS CONSOLE HIV ANTIBODY (ROUTINE TESTING): HIV: NONREACTIVE

## 2021-09-19 LAB — OB RESULTS CONSOLE RUBELLA ANTIBODY, IGM: Rubella: IMMUNE

## 2021-09-24 NOTE — L&D Delivery Note (Signed)
Delivery Note At 11:22 AM a viable and healthy female was delivered via Vaginal, Spontaneous (Presentation: Middle Occiput Anterior).  APGAR: 4, 8; weight 7 lb 15.7 oz (3620 g).   Placenta status: Spontaneous, Intact.  Cord: 3 vessels with the following complications: Loose nuchal x 1.  Cord pH: n/a  Anesthesia: Epidural Episiotomy: None Lacerations: None Suture Repair: N/A Est. Blood Loss (mL): 75  Additional TXA given at time of placenta separation preventatively due to critical Hgb on admission, but bleeding appropriate and uterine fundus firm  Mom to postpartum.  Baby to Couplet care / Skin to Skin.  Yamilka Lopiccolo A Mannie Ohlin 04/04/2022, 12:12 PM

## 2021-11-27 ENCOUNTER — Encounter: Payer: Self-pay | Admitting: *Deleted

## 2021-11-27 ENCOUNTER — Other Ambulatory Visit: Payer: Self-pay | Admitting: *Deleted

## 2021-11-27 ENCOUNTER — Ambulatory Visit (HOSPITAL_BASED_OUTPATIENT_CLINIC_OR_DEPARTMENT_OTHER): Payer: 59 | Admitting: Obstetrics and Gynecology

## 2021-11-27 ENCOUNTER — Ambulatory Visit: Payer: 59 | Attending: Obstetrics and Gynecology

## 2021-11-27 ENCOUNTER — Ambulatory Visit: Payer: 59 | Admitting: *Deleted

## 2021-11-27 ENCOUNTER — Other Ambulatory Visit: Payer: Self-pay | Admitting: Obstetrics and Gynecology

## 2021-11-27 ENCOUNTER — Other Ambulatory Visit: Payer: Self-pay

## 2021-11-27 VITALS — BP 122/70 | HR 101

## 2021-11-27 DIAGNOSIS — Z3689 Encounter for other specified antenatal screening: Secondary | ICD-10-CM | POA: Diagnosis present

## 2021-11-27 DIAGNOSIS — O35EXX0 Maternal care for other (suspected) fetal abnormality and damage, fetal genitourinary anomalies, not applicable or unspecified: Secondary | ICD-10-CM

## 2021-11-27 DIAGNOSIS — Z3A21 21 weeks gestation of pregnancy: Secondary | ICD-10-CM | POA: Diagnosis not present

## 2021-11-27 DIAGNOSIS — Z363 Encounter for antenatal screening for malformations: Secondary | ICD-10-CM

## 2021-11-27 DIAGNOSIS — Z362 Encounter for other antenatal screening follow-up: Secondary | ICD-10-CM

## 2021-11-27 NOTE — Progress Notes (Signed)
Maternal-Fetal Medicine  ?Name: Rebecca Salazar ?DOB: [birth year] ?MRN: [id number] ?Referring Provider: Rhoderick Moody, MD ? ?I had the pleasure of seeing Ms. Sal[facility] at [facility]. 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 ? ?On cell-free fetal DNA screening, the risks of fetal aneuploidies are not increased. ?Past medical history: No history of diabetes or hypertension.  She had recurrent genital herpes infection that was treated. ?Past surgical history: Nil of note. ?Obstetric history significant for a term vaginal delivery in 2017 of a female infant weighing 7 pounds and 10 ounces at birth.  Her daughter is in good health.  She had 4 early miscarriages. ? ?GYN history: No history of abnormal Pap smears or cervical surgeries. ?Allergies: No known drug allergies. ?Social history: Denies tobacco, drug or alcohol use.  Her partner is African-American and he is in good health he is not the father of her first child. ?Family history: No history of venous thromboembolism in the family. ? ?Ultrasound ?We performed a fetal anatomical survey.  Amniotic fluid is normal and good fetal activity seen.  Fetal biometry is consistent with the previously established dates. Fetal abdomen was difficult to measure because of fetal position (EFW can be underestimated). ? 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  Left kidney appears normal.  Thin-walled cyst is seen inside the bladder suggesting the possibility of ureterocele.   ?Rest of the fetal anatomy appears normal. ? 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 It is a developmental anomaly where the communication from the kidney to the ureter is blocked. ? ?-It is reassuring to see the other kidney not involved. In about 30% of cases, the contralateral kidney can also be involved and, particularly,  vesicoureteric reflux can occur. Serial ultrasound is necessary to evaluate the kidney. The contralateral kidney can undergo compensatory enlargement. ? ?-Incidence of MCDK is about 1in 4,000 births. ? ?-The association with chromosomal anomaly with isolated MCDK is very low. Risk of non-chromosomal syndromes is about 5% to 10%. ? ?-I reassured the patient of otherwise normal looking fetal anatomical survey. She understands the limitations of ultrasound in detecting fetal anomalies. ? ?-I discussed amniocentesis for a definitive result on the fetal karyotype. Amniocentesis (microarray) identifies some, but not all, genetic conditions. Patient opted not to have amniocentesis. ?-Postnatal evaluation will determine the exact pathology. ? ?-The incidence of malignancy in the affected kidney is not increased and the likelihood of hypertension is very low. ?-Vaginal delivery is not contraindicated. Provided the amniotic fluid is normal and fetal growth is appropriate, early delivery is not indicated. ? ?-I reassured her that cardiac anatomy appears normal. I offered to set up an appointment for fetal echocardiography. Patient opted not to have fetal echocaridiography. ? ?Recommendations ?-An appointment was made for her to return in 4 weeks for completion of fetal anatomy. ?-Fetal growth assessments every 4 weeks till delivery. ? ?Thank you for consultation.  If you have any questions or concerns, please contact me the [facility].  Consultation including face-to-face (more than 50%) counseling 30 minutes. ? ?

## 2021-12-19 DIAGNOSIS — B3731 Acute candidiasis of vulva and vagina: Secondary | ICD-10-CM | POA: Insufficient documentation

## 2021-12-25 ENCOUNTER — Other Ambulatory Visit: Payer: Self-pay | Admitting: *Deleted

## 2021-12-25 ENCOUNTER — Ambulatory Visit (HOSPITAL_BASED_OUTPATIENT_CLINIC_OR_DEPARTMENT_OTHER): Payer: 59

## 2021-12-25 ENCOUNTER — Ambulatory Visit: Payer: 59 | Attending: Obstetrics and Gynecology

## 2021-12-25 ENCOUNTER — Ambulatory Visit: Payer: Self-pay | Admitting: Genetics

## 2021-12-25 ENCOUNTER — Ambulatory Visit: Payer: 59 | Admitting: *Deleted

## 2021-12-25 VITALS — BP 124/74 | HR 99

## 2021-12-25 DIAGNOSIS — Z3A25 25 weeks gestation of pregnancy: Secondary | ICD-10-CM

## 2021-12-25 DIAGNOSIS — O99012 Anemia complicating pregnancy, second trimester: Secondary | ICD-10-CM

## 2021-12-25 DIAGNOSIS — O98512 Other viral diseases complicating pregnancy, second trimester: Secondary | ICD-10-CM | POA: Diagnosis not present

## 2021-12-25 DIAGNOSIS — O283 Abnormal ultrasonic finding on antenatal screening of mother: Secondary | ICD-10-CM

## 2021-12-25 DIAGNOSIS — D649 Anemia, unspecified: Secondary | ICD-10-CM

## 2021-12-25 DIAGNOSIS — Z362 Encounter for other antenatal screening follow-up: Secondary | ICD-10-CM | POA: Diagnosis present

## 2021-12-25 DIAGNOSIS — O35EXX0 Maternal care for other (suspected) fetal abnormality and damage, fetal genitourinary anomalies, not applicable or unspecified: Secondary | ICD-10-CM | POA: Diagnosis not present

## 2021-12-25 DIAGNOSIS — O210 Mild hyperemesis gravidarum: Secondary | ICD-10-CM

## 2021-12-25 DIAGNOSIS — B009 Herpesviral infection, unspecified: Secondary | ICD-10-CM

## 2021-12-25 NOTE — Progress Notes (Signed)
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   ?EDC: 04/09/2022 LMP: 07/03/2021 Referring Provider:  ?Charyl Bigger, MD  ?EGA: [redacted]w[redacted]d Genetic Counselor: ?Rebecca Righter, MS, CGC  ?OB Hx: [id number] Date of Salazar: 12/25/2021  ?Accompanied by: Unaccompanied Face to Face Time: 30 Minutes  ? ?Previous Testing Completed: ?It is reported in prenatal clinic notes from Juanice' referring provider's office that she previously completed Non-Invasive Prenatal Screening (NIPS) in this pregnancy. It is reported that the NIPS result is low risk, however, this result is not available for genetic counseling to review at the time of Rebecca Salazar.  ? ?Medical History:  ?Rebecca Salazar reports this is her 6th pregnancy. She reports she has a daughter from a previous partner. She reports she has had 4 miscarriages with her current partner. ?Denies personal history of diabetes, high blood pressure, thyroid conditions, and seizures. ?Denies bleeding, infections, and fevers in this pregnancy. ?Denies using tobacco, alcohol, or street drugs in this pregnancy.  ? ?Family History: A pedigree was created and scanned into Epic under the Media tab. ?Rebecca Salazar reports her maternal half brother has a son with Autism. ?Maternal ethnicity reported as Research scientist (physical sciences) and paternal ethnicity reported as Research scientist (physical sciences). ?Denies Ashkenazi Jewish ancestry. ?Family history not remarkable for consanguinity, individuals with birth defects, intellectual disability, still births, or unexplained neonatal death.  ?   ?Genetic Counseling: ? ?Multicystic Dysplastic Kidney (Salazar) visualized on ultrasound. Salazar is an abnormality of the kidney characterized by multiple cysts of varying sizes, absence of renal functioning, and atresia or hypoplasia of the ureter. Salazar is one of the most common abnormalities detected by prenatal ultrasound and  can be unilateral or bilateral. It may be isolated or associated with additional fetal abnormalities. Approximately 15-75% of cases of Salazar are associated with other genitourinary abnormalities, most commonly vesicoureteral reflux (where urine backs up from the bladder into the kidneys and causes dilation). A broad spectrum of other non-genitourinary abnormalities have been reported with Salazar including esophageal atresia, congenital heart defects, anencephaly, hydrocephalus, spina bifida, cleft palate, microphthalmia, duodenal stenosis, tracheoesophageal fistula, and imperforate anus. Non-genitourinary abnormalities are more commonly associated with bilateral Salazar compared to unilateral Salazar. There are several chromosome and single-gene conditions associated with Salazar. The incidence of genetic conditions is higher in cases of bilateral Salazar and cases with associated non-genitourinary abnormalities. Renal abnormalities, including Salazar, can be inherited in families in an autosomal dominant pattern with variable expressivity. However, isolated non-syndromic Salazar is most often multifactorial, occurring due to a combination of genetic, lifestyle, and environmental factors that are difficult to understand. Genetic counseling offered Rebecca Salazar the option of Salazar for prenatal diagnosis. Given that there is no known family history of renal abnormalities and no other abnormal fetal findings at this time, a genetic cause for Rebecca Salazar may not be identified. Rebecca Salazar for prenatal diagnosis at this time.  ? ?Family history of Autism Spectrum Disorder. Rebecca Salazar reports her maternal half brother has a son with Autism. Autism Spectrum Disorder affects approximately 1-2% of the general population in the [place], [place], and [place]. Autism is a neurological and developmental disorder that affects how people interact with others, communicate, learn, and behave. Autism is known as a "spectrum"  disorder because there is wide variation in the type and severity of symptoms people experience. Genetic testing for individuals with a clinical diagnosis of Autism yields an explanation in only about 20% of cases, and the remaining 80%  of cases are left with unknown etiology. We are unable to test directly for Autism in pregnancy. Given the distant relation of the individual with Autism to the current pregnancy, the risk for the current pregnancy to also have Autism is not likely to be increased above the general population risk. ? ?Birth Defects. All babies have approximately a 3-5% risk for a birth defect and a majority of these defects cannot be detected through the screening or diagnostic testing listed below. Ultrasound may detect some birth defects, but it may not detect all birth defects. About half of pregnancies with Down syndrome do not show any soft markers on ultrasound. A normal ultrasound does not guarantee a healthy pregnancy.  ? ?Testing/Screening Options:  ? ?Salazar. This procedure is available for prenatal diagnosis. Possible procedural difficulties and complications that can arise include maternal infection, cramping, bleeding, fluid leakage, and/or pregnancy loss. The risk for pregnancy loss with an Salazar is 1/500. Per the [employer] (ACOG) Practice Bulletin 162, all pregnant women should be offered prenatal assessment for aneuploidy by diagnostic testing regardless of maternal age or other risk factors. If indicated, genetic testing that could be ordered on an Salazar sample includes a fetal karyotype, fetal microarray, and testing for specific syndromes. ?  ?  ?Patient Plan: ? ?Proceed with: Routine prenatal care ?Informed consent was obtained. All questions were answered. ? ?Declined: Salazar for prenatal diagnosis  ? ?Thank you for sharing in the care of Rebecca Salazar with [place].  ?Please do not hesitate to contact us if you have any  questions. ? ?Rebecca Righter, MS, CGC ?Certified Genetic Counselor ?

## 2021-12-26 ENCOUNTER — Telehealth: Payer: Self-pay

## 2022-01-15 LAB — OB RESULTS CONSOLE RPR: RPR: NONREACTIVE

## 2022-01-17 ENCOUNTER — Other Ambulatory Visit: Payer: Self-pay | Admitting: Obstetrics and Gynecology

## 2022-01-17 DIAGNOSIS — O99013 Anemia complicating pregnancy, third trimester: Secondary | ICD-10-CM

## 2022-01-22 ENCOUNTER — Other Ambulatory Visit: Payer: Self-pay | Admitting: *Deleted

## 2022-01-22 ENCOUNTER — Ambulatory Visit: Payer: 59 | Admitting: *Deleted

## 2022-01-22 ENCOUNTER — Ambulatory Visit: Payer: 59 | Attending: Obstetrics and Gynecology

## 2022-01-22 VITALS — BP 122/74 | HR 113

## 2022-01-22 DIAGNOSIS — Z3A29 29 weeks gestation of pregnancy: Secondary | ICD-10-CM | POA: Diagnosis not present

## 2022-01-22 DIAGNOSIS — Q614 Renal dysplasia: Secondary | ICD-10-CM | POA: Insufficient documentation

## 2022-01-22 DIAGNOSIS — D649 Anemia, unspecified: Secondary | ICD-10-CM | POA: Diagnosis not present

## 2022-01-22 DIAGNOSIS — O35EXX0 Maternal care for other (suspected) fetal abnormality and damage, fetal genitourinary anomalies, not applicable or unspecified: Secondary | ICD-10-CM

## 2022-01-22 DIAGNOSIS — O99013 Anemia complicating pregnancy, third trimester: Secondary | ICD-10-CM | POA: Diagnosis not present

## 2022-01-22 DIAGNOSIS — O283 Abnormal ultrasonic finding on antenatal screening of mother: Secondary | ICD-10-CM | POA: Insufficient documentation

## 2022-01-26 ENCOUNTER — Encounter (HOSPITAL_COMMUNITY): Payer: 59

## 2022-02-26 ENCOUNTER — Ambulatory Visit: Payer: 59 | Attending: Obstetrics

## 2022-02-26 ENCOUNTER — Ambulatory Visit: Payer: 59 | Admitting: *Deleted

## 2022-02-26 VITALS — BP 125/69 | HR 102

## 2022-02-26 DIAGNOSIS — Z3A34 34 weeks gestation of pregnancy: Secondary | ICD-10-CM | POA: Diagnosis not present

## 2022-02-26 DIAGNOSIS — O35EXX0 Maternal care for other (suspected) fetal abnormality and damage, fetal genitourinary anomalies, not applicable or unspecified: Secondary | ICD-10-CM

## 2022-02-27 ENCOUNTER — Other Ambulatory Visit: Payer: Self-pay | Admitting: *Deleted

## 2022-02-27 DIAGNOSIS — Q614 Renal dysplasia: Secondary | ICD-10-CM

## 2022-03-13 LAB — OB RESULTS CONSOLE GBS: GBS: NEGATIVE

## 2022-03-20 ENCOUNTER — Other Ambulatory Visit: Payer: Self-pay | Admitting: Obstetrics and Gynecology

## 2022-03-20 DIAGNOSIS — O99013 Anemia complicating pregnancy, third trimester: Secondary | ICD-10-CM

## 2022-03-21 ENCOUNTER — Ambulatory Visit: Payer: 59 | Attending: Obstetrics and Gynecology

## 2022-03-21 ENCOUNTER — Encounter: Payer: Self-pay | Admitting: *Deleted

## 2022-03-21 ENCOUNTER — Ambulatory Visit: Payer: 59 | Admitting: *Deleted

## 2022-03-21 VITALS — BP 118/71 | HR 104

## 2022-03-21 DIAGNOSIS — O35EXX0 Maternal care for other (suspected) fetal abnormality and damage, fetal genitourinary anomalies, not applicable or unspecified: Secondary | ICD-10-CM | POA: Diagnosis not present

## 2022-03-21 DIAGNOSIS — Z3A37 37 weeks gestation of pregnancy: Secondary | ICD-10-CM

## 2022-03-21 DIAGNOSIS — Q614 Renal dysplasia: Secondary | ICD-10-CM | POA: Insufficient documentation

## 2022-03-21 DIAGNOSIS — Z3689 Encounter for other specified antenatal screening: Secondary | ICD-10-CM | POA: Diagnosis present

## 2022-03-26 ENCOUNTER — Encounter (HOSPITAL_COMMUNITY): Payer: 59

## 2022-04-02 ENCOUNTER — Other Ambulatory Visit: Payer: Self-pay | Admitting: Obstetrics and Gynecology

## 2022-04-03 ENCOUNTER — Inpatient Hospital Stay (HOSPITAL_COMMUNITY)
Admission: AD | Admit: 2022-04-03 | Discharge: 2022-04-06 | DRG: 807 | Disposition: A | Discharge status: Home / Self Care | Payer: 59 | Attending: Obstetrics and Gynecology | Admitting: Obstetrics and Gynecology

## 2022-04-03 ENCOUNTER — Encounter (HOSPITAL_COMMUNITY): Payer: Self-pay | Admitting: Obstetrics and Gynecology

## 2022-04-03 ENCOUNTER — Inpatient Hospital Stay (HOSPITAL_COMMUNITY): Payer: 59

## 2022-04-03 ENCOUNTER — Other Ambulatory Visit: Payer: Self-pay

## 2022-04-03 DIAGNOSIS — O9902 Anemia complicating childbirth: Principal | ICD-10-CM | POA: Diagnosis present

## 2022-04-03 DIAGNOSIS — Z20822 Contact with and (suspected) exposure to covid-19: Secondary | ICD-10-CM | POA: Diagnosis present

## 2022-04-03 DIAGNOSIS — O99013 Anemia complicating pregnancy, third trimester: Secondary | ICD-10-CM | POA: Diagnosis present

## 2022-04-03 DIAGNOSIS — D508 Other iron deficiency anemias: Secondary | ICD-10-CM | POA: Diagnosis present

## 2022-04-03 DIAGNOSIS — Z3490 Encounter for supervision of normal pregnancy, unspecified, unspecified trimester: Secondary | ICD-10-CM | POA: Diagnosis present

## 2022-04-03 DIAGNOSIS — O26893 Other specified pregnancy related conditions, third trimester: Secondary | ICD-10-CM | POA: Diagnosis present

## 2022-04-03 DIAGNOSIS — Z3A39 39 weeks gestation of pregnancy: Secondary | ICD-10-CM | POA: Diagnosis not present

## 2022-04-03 LAB — RESP PANEL BY RT-PCR (FLU A&B, COVID) ARPGX2
Influenza A by PCR: NEGATIVE
Influenza B by PCR: NEGATIVE
SARS Coronavirus 2 by RT PCR: NEGATIVE

## 2022-04-03 LAB — CBC
HCT: 24.9 % — ABNORMAL LOW (ref 36.0–46.0)
Hemoglobin: 7.2 g/dL — ABNORMAL LOW (ref 12.0–15.0)
MCH: 19.6 pg — ABNORMAL LOW (ref 26.0–34.0)
MCHC: 28.9 g/dL — ABNORMAL LOW (ref 30.0–36.0)
MCV: 67.7 fL — ABNORMAL LOW (ref 80.0–100.0)
Platelets: 273 10*3/uL (ref 150–400)
RBC: 3.68 MIL/uL — ABNORMAL LOW (ref 3.87–5.11)
RDW: 22.1 % — ABNORMAL HIGH (ref 11.5–15.5)
WBC: 16.5 10*3/uL — ABNORMAL HIGH (ref 4.0–10.5)
nRBC: 0.8 % — ABNORMAL HIGH (ref 0.0–0.2)

## 2022-04-03 MED ORDER — LACTATED RINGERS IV SOLN: INTRAVENOUS | Status: DC

## 2022-04-03 MED ORDER — DIPHENHYDRAMINE HCL 25 MG PO CAPS: 25.0000 mg | ORAL_CAPSULE | ORAL | Status: DC

## 2022-04-03 MED ORDER — OXYTOCIN BOLUS FROM INFUSION
333.0000 mL | Freq: Once | INTRAVENOUS | Status: AC
  Administered 2022-04-04: 333 mL via INTRAVENOUS

## 2022-04-03 MED ORDER — DIPHENHYDRAMINE HCL 25 MG PO CAPS
25.0000 mg | ORAL_CAPSULE | ORAL | Status: DC
  Administered 2022-04-03: 25 mg via ORAL
  Filled 2022-04-03: qty 1

## 2022-04-03 MED ORDER — MISOPROSTOL 25 MCG QUARTER TABLET
25.0000 ug | ORAL_TABLET | ORAL | Status: DC | PRN
Start: 2022-04-03 — End: 2022-04-04
  Administered 2022-04-03 – 2022-04-04 (×2): 25 ug via VAGINAL
  Filled 2022-04-03 (×3): qty 1

## 2022-04-03 MED ORDER — OXYTOCIN-SODIUM CHLORIDE 30-0.9 UT/500ML-% IV SOLN
2.5000 [IU]/h | INTRAVENOUS | Status: DC
  Administered 2022-04-04: 2.5 [IU]/h via INTRAVENOUS

## 2022-04-03 MED ORDER — LIDOCAINE HCL (PF) 1 % IJ SOLN: 30.0000 mL | INTRAMUSCULAR | Status: DC | PRN

## 2022-04-03 MED ORDER — TERBUTALINE SULFATE 1 MG/ML IJ SOLN: 0.2500 mg | Freq: Once | INTRAMUSCULAR | Status: DC | PRN

## 2022-04-03 MED ORDER — ACETAMINOPHEN 325 MG PO TABS
650.0000 mg | ORAL_TABLET | ORAL | Status: DC | PRN
Start: 2022-04-03 — End: 2022-04-04

## 2022-04-03 MED ORDER — ACETAMINOPHEN 500 MG PO TABS
500.0000 mg | ORAL_TABLET | ORAL | Status: DC
  Administered 2022-04-03: 500 mg via ORAL
  Filled 2022-04-03: qty 1

## 2022-04-03 MED ORDER — ONDANSETRON HCL 4 MG/2ML IJ SOLN: 4.0000 mg | Freq: Four times a day (QID) | INTRAMUSCULAR | Status: DC | PRN

## 2022-04-03 MED ORDER — LACTATED RINGERS IV SOLN: 500.0000 mL | INTRAVENOUS | Status: DC | PRN

## 2022-04-03 MED ORDER — SOD CITRATE-CITRIC ACID 500-334 MG/5ML PO SOLN: 30.0000 mL | ORAL | Status: DC | PRN

## 2022-04-03 MED ORDER — ACETAMINOPHEN 500 MG PO TABS: 500.0000 mg | ORAL_TABLET | ORAL | Status: DC

## 2022-04-03 MED ORDER — SODIUM CHLORIDE 0.9 % IV SOLN: 500.0000 mg | INTRAVENOUS | Status: DC

## 2022-04-03 MED ORDER — SODIUM CHLORIDE 0.9 % IV SOLN
500.0000 mg | INTRAVENOUS | Status: DC
  Administered 2022-04-03: 500 mg via INTRAVENOUS
  Filled 2022-04-03: qty 25

## 2022-04-04 ENCOUNTER — Inpatient Hospital Stay (HOSPITAL_COMMUNITY): Payer: 59 | Admitting: Anesthesiology

## 2022-04-04 ENCOUNTER — Encounter (HOSPITAL_COMMUNITY): Payer: Self-pay | Admitting: Obstetrics and Gynecology

## 2022-04-04 LAB — PREPARE RBC (CROSSMATCH)

## 2022-04-04 LAB — CBC
HCT: 25.9 % — ABNORMAL LOW (ref 36.0–46.0)
HCT: 23.2 % — ABNORMAL LOW (ref 36.0–46.0)
Hemoglobin: 7.6 g/dL — ABNORMAL LOW (ref 12.0–15.0)
Hemoglobin: 6.8 g/dL — CL (ref 12.0–15.0)
MCH: 20 pg — ABNORMAL LOW (ref 26.0–34.0)
MCH: 20.2 pg — ABNORMAL LOW (ref 26.0–34.0)
MCHC: 29.3 g/dL — ABNORMAL LOW (ref 30.0–36.0)
MCHC: 29.3 g/dL — ABNORMAL LOW (ref 30.0–36.0)
MCV: 68.2 fL — ABNORMAL LOW (ref 80.0–100.0)
MCV: 69 fL — ABNORMAL LOW (ref 80.0–100.0)
Platelets: 267 10*3/uL (ref 150–400)
Platelets: 216 10*3/uL (ref 150–400)
RBC: 3.8 MIL/uL — ABNORMAL LOW (ref 3.87–5.11)
RBC: 3.36 MIL/uL — ABNORMAL LOW (ref 3.87–5.11)
RDW: 22 % — ABNORMAL HIGH (ref 11.5–15.5)
RDW: 22 % — ABNORMAL HIGH (ref 11.5–15.5)
WBC: 17.6 10*3/uL — ABNORMAL HIGH (ref 4.0–10.5)
WBC: 18.3 10*3/uL — ABNORMAL HIGH (ref 4.0–10.5)
nRBC: 0.8 % — ABNORMAL HIGH (ref 0.0–0.2)
nRBC: 1.1 % — ABNORMAL HIGH (ref 0.0–0.2)

## 2022-04-04 LAB — RPR: RPR Ser Ql: NONREACTIVE

## 2022-04-04 MED ORDER — PHENYLEPHRINE 80 MCG/ML (10ML) SYRINGE FOR IV PUSH (FOR BLOOD PRESSURE SUPPORT): 80.0000 ug | PREFILLED_SYRINGE | INTRAVENOUS | Status: DC | PRN

## 2022-04-04 MED ORDER — TETANUS-DIPHTH-ACELL PERTUSSIS 5-2.5-18.5 LF-MCG/0.5 IM SUSY: 0.5000 mL | PREFILLED_SYRINGE | Freq: Once | INTRAMUSCULAR | Status: DC

## 2022-04-04 MED ORDER — ACETAMINOPHEN 325 MG PO TABS
650.0000 mg | ORAL_TABLET | ORAL | Status: DC | PRN
  Administered 2022-04-04: 650 mg via ORAL
  Filled 2022-04-04: qty 2

## 2022-04-04 MED ORDER — SIMETHICONE 80 MG PO CHEW: 80.0000 mg | CHEWABLE_TABLET | ORAL | Status: DC | PRN

## 2022-04-04 MED ORDER — DIPHENHYDRAMINE HCL 50 MG/ML IJ SOLN: 12.5000 mg | INTRAMUSCULAR | Status: DC | PRN

## 2022-04-04 MED ORDER — EPHEDRINE 5 MG/ML INJ
10.0000 mg | INTRAVENOUS | Status: DC | PRN
Start: 2022-04-04 — End: 2022-04-04

## 2022-04-04 MED ORDER — IBUPROFEN 600 MG PO TABS
600.0000 mg | ORAL_TABLET | Freq: Four times a day (QID) | ORAL | Status: DC
  Administered 2022-04-04 – 2022-04-06 (×3): 600 mg via ORAL
  Filled 2022-04-04 (×6): qty 1

## 2022-04-04 MED ORDER — EPHEDRINE 5 MG/ML INJ: 10.0000 mg | INTRAVENOUS | Status: DC | PRN

## 2022-04-04 MED ORDER — COCONUT OIL OIL: 1.0000 | TOPICAL_OIL | Status: DC | PRN

## 2022-04-04 MED ORDER — DIPHENHYDRAMINE HCL 25 MG PO CAPS
25.0000 mg | ORAL_CAPSULE | Freq: Once | ORAL | Status: AC
  Administered 2022-04-04: 25 mg via ORAL
  Filled 2022-04-04: qty 1

## 2022-04-04 MED ORDER — OXYTOCIN-SODIUM CHLORIDE 30-0.9 UT/500ML-% IV SOLN
1.0000 m[IU]/min | INTRAVENOUS | Status: DC
  Administered 2022-04-04: 2 m[IU]/min via INTRAVENOUS
  Filled 2022-04-04: qty 500

## 2022-04-04 MED ORDER — ONDANSETRON HCL 4 MG/2ML IJ SOLN: 4.0000 mg | INTRAMUSCULAR | Status: DC | PRN

## 2022-04-04 MED ORDER — FENTANYL-BUPIVACAINE-NACL 0.5-0.125-0.9 MG/250ML-% EP SOLN
12.0000 mL/h | EPIDURAL | Status: DC | PRN
  Administered 2022-04-04: 12.5 mL/h via EPIDURAL
  Filled 2022-04-04: qty 250

## 2022-04-04 MED ORDER — BENZOCAINE-MENTHOL 20-0.5 % EX AERO: 1.0000 | INHALATION_SPRAY | CUTANEOUS | Status: DC | PRN

## 2022-04-04 MED ORDER — TRANEXAMIC ACID-NACL 1000-0.7 MG/100ML-% IV SOLN: 1000.0000 mg | INTRAVENOUS | Status: DC

## 2022-04-04 MED ORDER — ZOLPIDEM TARTRATE 5 MG PO TABS: 5.0000 mg | ORAL_TABLET | Freq: Every evening | ORAL | Status: DC | PRN

## 2022-04-04 MED ORDER — LACTATED RINGERS IV SOLN
500.0000 mL | Freq: Once | INTRAVENOUS | Status: AC
  Administered 2022-04-04: 500 mL via INTRAVENOUS

## 2022-04-04 MED ORDER — SENNOSIDES-DOCUSATE SODIUM 8.6-50 MG PO TABS
2.0000 | ORAL_TABLET | Freq: Every day | ORAL | Status: DC
Start: 2022-04-05 — End: 2022-04-06
  Filled 2022-04-04 (×2): qty 2

## 2022-04-04 MED ORDER — FENTANYL CITRATE (PF) 100 MCG/2ML IJ SOLN
100.0000 ug | INTRAMUSCULAR | Status: DC | PRN
  Administered 2022-04-04 (×3): 100 ug via INTRAVENOUS
  Filled 2022-04-04 (×3): qty 2

## 2022-04-04 MED ORDER — LIDOCAINE HCL (PF) 1 % IJ SOLN
INTRAMUSCULAR | Status: DC | PRN
  Administered 2022-04-04: 5 mL via EPIDURAL
  Administered 2022-04-04: 6 mL via EPIDURAL

## 2022-04-04 MED ORDER — SODIUM CHLORIDE 0.9% IV SOLUTION: Freq: Once | INTRAVENOUS | Status: AC

## 2022-04-04 MED ORDER — DIPHENHYDRAMINE HCL 25 MG PO CAPS: 25.0000 mg | ORAL_CAPSULE | Freq: Four times a day (QID) | ORAL | Status: DC | PRN

## 2022-04-04 MED ORDER — DIBUCAINE (PERIANAL) 1 % EX OINT: 1.0000 | TOPICAL_OINTMENT | CUTANEOUS | Status: DC | PRN

## 2022-04-04 MED ORDER — ACETAMINOPHEN 325 MG PO TABS
650.0000 mg | ORAL_TABLET | Freq: Once | ORAL | Status: AC
  Administered 2022-04-04: 650 mg via ORAL
  Filled 2022-04-04: qty 2

## 2022-04-04 MED ORDER — WITCH HAZEL-GLYCERIN EX PADS: 1.0000 | MEDICATED_PAD | CUTANEOUS | Status: DC | PRN

## 2022-04-04 MED ORDER — TRANEXAMIC ACID-NACL 1000-0.7 MG/100ML-% IV SOLN
INTRAVENOUS | Status: AC
  Administered 2022-04-04: 1000 mg
  Filled 2022-04-04: qty 100

## 2022-04-04 MED ORDER — PRENATAL MULTIVITAMIN CH
1.0000 | ORAL_TABLET | Freq: Every day | ORAL | Status: DC
  Administered 2022-04-04 – 2022-04-05 (×2): 1 via ORAL
  Filled 2022-04-04 (×2): qty 1

## 2022-04-04 MED ORDER — SODIUM CHLORIDE 0.9% IV SOLUTION: Freq: Once | INTRAVENOUS | Status: DC

## 2022-04-04 MED ORDER — ONDANSETRON HCL 4 MG PO TABS: 4.0000 mg | ORAL_TABLET | ORAL | Status: DC | PRN

## 2022-04-04 MED ORDER — TERBUTALINE SULFATE 1 MG/ML IJ SOLN: 0.2500 mg | Freq: Once | INTRAMUSCULAR | Status: DC | PRN

## 2022-04-04 NOTE — Progress Notes (Signed)
CRITICAL VALUE STICKER  CRITICAL VALUE: 6.8 Hgb  RECEIVER (on-site recipient of call): Henderson Newcomer, RN  DATE & TIME NOTIFIED:  04/04/22 @ 1847  MESSENGER (representative from lab): Zollie Beckers  MD NOTIFIED: Dr. Ernestina Penna  TIME OF NOTIFICATION: 1850  RESPONSE:  transferred phone call into pt's room to consent for blood transfusion

## 2022-04-04 NOTE — H&P (Signed)
Rebecca Salazar is a 26 y.o. female [phone] [redacted]w[redacted]d presenting for elective induction at term.  Pregnancy dated by LMP c/w 8w sono. Routine prenatal care at [facility]. Routine prenatal labs significant for chronic IDA otherwise WNL. She had a low risk [facility] NIPS and negative AFP1 screen. Fetal anatomy scan significant for right multicystic dysplastic kidney and was referred to MFM for consultation. She had a detailed anatomy scan with MFM on 11/27/21 that again showed right multicystic dysplastic kidney but normal appearing left kidney and remaining anatomy WNL. She declined fetal echo and amniocentesis. She has since been followed by MFM with serial growth scans, most recently done on 6/28 that showed vertex presentation with an EFW of 6#15 (56%) normal AFI and anterior placenta. Plan for postnatal evaluation for fetal MCDK. She had a normal 1hr GTT of 101 and 28w Hgb drop to 8.1. Patient admitted to poor compliance with PO iron supplement. She was scheduled for IV Fe transfusions on multiple occassions but patient did not show for any of the scheduled visits. 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 Has been on antiviral suppression therapy with no recent outbreaks. History of previous NSVD with daughter Rebecca Ridgel', pelvis proven to 8# uncomplicated delivery.   OB History     Gravida  6   Para  1   Term  1   Preterm  0   AB  4   Living  1      SAB  3   IAB  1   Ectopic  0   Multiple  0   Live Births  1          Past Medical History:  Diagnosis Date   Herpes    History reviewed. No pertinent surgical history. Family History: family history includes Hypertension in her father. Social History:  reports that she quit smoking about 2 years ago. Her smoking use included cigarettes. She smoked an average of .5 packs per day. She has never used smokeless tobacco. She reports that she does not currently use alcohol. She reports that she  does not currently use drugs after having used the following drugs: Marijuana.     Maternal Diabetes: No Genetic Screening: Normal Maternal Ultrasounds/Referrals: Fetal Kidney Anomalies Fetal Ultrasounds or other Referrals:  Referred to Materal Fetal Medicine  Maternal Substance Abuse:  No Significant Maternal Medications:  Meds include: Other:  Significant Maternal Lab Results:  Group B Strep negative Other Comments:  None  Review of Systems  All other systems reviewed and are negative.  Per HPI Maternal Exam:  Abdomen: Estimated fetal weight is 7#5.   Fetal presentation: vertex Introitus: Normal vulva. Normal vagina.  Pelvis: adequate for delivery.     Fetal Exam Fetal State Assessment: Category I - tracings are normal.   Physical Exam Vitals reviewed.  Constitutional:      Appearance: She is normal weight.  HENT:     Head: Normocephalic.  Cardiovascular:     Rate and Rhythm: Normal rate.  Pulmonary:     Effort: Pulmonary effort is normal.  Abdominal:     Tenderness: There is no abdominal tenderness.  Genitourinary:    General: Normal vulva.  Musculoskeletal:        General: Normal range of motion.     Cervical back: Normal range of motion.  Skin:    General: Skin is warm and dry.  Neurological:     General: No focal deficit present.     Mental Status: She  is alert and oriented to person, place, and time.  Psychiatric:        Mood and Affect: Mood normal.        Behavior: Behavior normal.     Dilation: 2 Effacement (%): 70 Station: -3 Exam by:: B boyer RN Blood pressure (!) 146/58, pulse 82, temperature 98.3 F (36.8 C), temperature source Oral, resp. rate 16, height 5\' 9"  (1.753 m), weight 101.4 kg, last menstrual period 07/03/2021, unknown if currently breastfeeding.  Prenatal labs: ABO, Rh:  --/--/O POS (07/11 2030) Antibody: NEG (07/11 2030) Rubella: Immune (12/27 0000) RPR: Nonreactive (04/24 0000)  HBsAg: Negative (12/27 0000)  HIV:  Non-reactive (12/27 0000)  GBS: Negative/-- (06/20 0000)   ChemistryNo results for input(s): "NA", "K", "CL", "CO2", "GLUCOSE", "BUN", "CREATININE", "CALCIUM", "GFRNONAA", "GFRAA", "ANIONGAP" in the last 168 hours.  No results for input(s): "PROT", "ALBUMIN", "AST", "ALT", "ALKPHOS", "BILITOT" in the last 168 hours. Hematology Recent Labs  Lab 04/03/22 2032  WBC 16.5*  RBC 3.68*  HGB 7.2*  HCT 24.9*  MCV 67.7*  MCH 19.6*  MCHC 28.9*  RDW 22.1*  PLT 273   Cardiac EnzymesNo results for input(s): "TROPONINI" in the last 168 hours. No results for input(s): "TROPIPOC" in the last 168 hours.  BNPNo results for input(s): "BNP", "PROBNP" in the last 168 hours.  DDimer No results for input(s): "DDIMER" in the last 168 hours.   Assessment/Plan: Rebecca Salazar is a 26 y.o. female [phone] [redacted]w[redacted]d admitted for elective IOL at term  -Admit to LD -Routine admission labs -Chronic IDA, non-compliant with PO/IV Fe treatments antepartum, admission Hgb 7.1. Plan for highly active management of intrapartum/postpartum bleeding, repeat CBC and IV Fe inpatient, possible blood products -Fetal right MCDK, notify Peds for postnatal evaluation and follow up -IOL: s/p 2 doses of Cytotec now on Pitocin 71mu, inc by 2's per protocol and plan for AROM -GBS NEG -Cont EFM/Toco- Cat I -Patient may have epidural on request for labor pain management -RH POS, Rub IMM -Routine intrapartum care -Anticipate SVD  Hershel Corkery A Sadiya Durand 04/04/2022, 7:23 AM

## 2022-04-04 NOTE — Anesthesia Procedure Notes (Signed)
Epidural Patient location during procedure: OB Start time: 04/04/2022 8:56 AM End time: 04/04/2022 9:06 AM  Staffing Anesthesiologist: Mal Amabile, MD Performed: anesthesiologist   Preanesthetic Checklist Completed: patient identified, IV checked, site marked, risks and benefits discussed, surgical consent, monitors and equipment checked, pre-op evaluation and timeout performed  Epidural Patient position: sitting Prep: DuraPrep and site prepped and draped Patient monitoring: continuous pulse ox and blood pressure Approach: midline Location: L3-L4 Injection technique: LOR air  Needle:  Needle type: Tuohy  Needle gauge: 17 G Needle length: 9 cm and 9 Needle insertion depth: 6 cm Catheter type: closed end flexible Catheter size: 19 Gauge Catheter at skin depth: 11 cm Test dose: negative and Other  Assessment Events: blood not aspirated, injection not painful, no injection resistance, no paresthesia and negative IV test  Additional Notes Patient identified. Risks and benefits discussed including failed block, incomplete  Pain control, post dural puncture headache, nerve damage, paralysis, blood pressure Changes, nausea, vomiting, reactions to medications-both toxic and allergic and post Partum back pain. All questions were answered. Patient expressed understanding and wished to proceed. Sterile technique was used throughout procedure. Epidural site was Dressed with sterile barrier dressing. No paresthesias, signs of intravascular injection Or signs of intrathecal spread were encountered.  Patient was more comfortable after the epidural was dosed. Please see RN's note for documentation of vital signs and FHR which are stable. Reason for block:procedure for pain

## 2022-04-04 NOTE — Progress Notes (Signed)
Labor Progress Note  S/O: Pt comfortable with epidural  Vitals:   04/04/22 0920 04/04/22 0950  BP: (!) 125/43 136/81  Pulse: 68 85  Resp:    Temp:    SpO2: 97%     SVE: 4/100/-1 bulging bag of water broke with examination for clear water  EFM: cat I baseline 140 bpm mod var +accels, -decels Toco: ctxs q 2 min  A/P: 25Y [id number] @ 39.2 elective IOL GBS-/Rh+  -IOL: s/p 2 doses of Cytotec, now on Pitocin 60mU with excellent cervical change. AROM with exam clear fluid -Cont EFM/Toco -Epidural labor pain mgmt -Critical Hgb 7.6, will crossmatch 2U to hold, active mgmt of bleeding at delivery/postpartum, repeat CBC as indicated -Fetal rt MCDK, Peds to be notified postnatal care -Routine intrapartum care -Anticipate SVD  Providence Stivers A Dajion Bickford 04/04/22 9:56 AM

## 2022-04-04 NOTE — Lactation Note (Signed)
This note was copied from a baby's chart. Lactation Consultation Note  Patient Name: Rebecca Salazar Today's Date: 04/04/2022 Reason for consult: Initial assessment;Term;Breastfeeding assistance Age:26 hours  P2, Term, Infant Female  Infant at 23 hours old. Mom states that breastfeeding is going well. She denies any pain or discomfort. Per mom, she had issues with milk production with her first baby. LC set mom up with DEBP due to low milk production to support her supply. Mom was encouraged to put baby to the breast and pump at every other feed.   LC reviewed washing pump parts, milk storage, outpatient LC services, feeding cues, cluster feeding, supply and demand, infant I/O for days 1 and 2, and milk production in the first few days.   Mom states that she has been taught how to hand express and feels comfortable hand expressing.   Current Feeding Plan:  Breastfeed according to feeding cues 8+ times in 24 hours.  Hand express for stimulation and spoon feed the expressed milk to baby via a spoon.  Pump every other feed and feed expressed milk to baby via spoon or bottle.  Call RN/LC for breastfeeding assistance.   Maternal Data Has patient been taught Hand Expression?: Yes Does the patient have breastfeeding experience prior to this delivery?: Yes How long did the patient breastfeed?: Mom states that she tried breast feeding for a while and was only able to produce on 1 side.  Feeding Mother's Current Feeding Choice: Breast Milk and Formula Nipple Type: Nfant Slow Flow (purple)  LATCH Score                    Lactation Tools Discussed/Used Tools: Pump;Flanges Flange Size: 24 Breast pump type: Double-Electric Breast Pump Pump Education: Setup, frequency, and cleaning;Milk Storage Reason for Pumping: Low milk production with first child.  Interventions Interventions: Breast feeding basics reviewed;Education;DEBP;LC Services brochure  Discharge Pump:  Manual;Hands Free WIC Program: Yes (Mom states that she has WIC and already has an appointment.)  Consult Status Consult Status: Follow-up Date: 04/05/22 Follow-up type: In-patient    Delene Loll 04/04/2022, 5:40 PM

## 2022-04-04 NOTE — Anesthesia Preprocedure Evaluation (Addendum)
Anesthesia Evaluation  Patient identified by MRN, date of birth, ID band Patient awake    Reviewed: Allergy & Precautions, Patient's Chart, lab work & pertinent test results  Airway Mallampati: II       Dental no notable dental hx.    Pulmonary former smoker,    Pulmonary exam normal        Cardiovascular negative cardio ROS Normal cardiovascular exam     Neuro/Psych negative neurological ROS  negative psych ROS   GI/Hepatic Neg liver ROS, GERD  ,  Endo/Other  Obesity  Renal/GU negative Renal ROS  negative genitourinary   Musculoskeletal negative musculoskeletal ROS (+)   Abdominal (+) + obese,   Peds  Hematology  (+) Blood dyscrasia, anemia ,   Anesthesia Other Findings   Reproductive/Obstetrics (+) Pregnancy                            Anesthesia Physical Anesthesia Plan  ASA: 2  Anesthesia Plan: Epidural   Post-op Pain Management:    Induction: Intravenous  PONV Risk Score and Plan:   Airway Management Planned: Natural Airway  Additional Equipment: None  Intra-op Plan:   Post-operative Plan:   Informed Consent: I have reviewed the patients History and Physical, chart, labs and discussed the procedure including the risks, benefits and alternatives for the proposed anesthesia with the patient or authorized representative who has indicated his/her understanding and acceptance.       Plan Discussed with: Anesthesiologist  Anesthesia Plan Comments:         Anesthesia Quick Evaluation

## 2022-04-04 NOTE — Lactation Note (Signed)
This note was copied from a baby's chart. Lactation Consultation Note  Patient Name: Girl Dahlila Pfahler Today's Date: 04/04/2022 Reason for consult: L&D Initial assessment;Term;Breastfeeding assistance;Other (Comment) (LCL/D visit 30 mins PP. baby STS on moms chest and wide awake rooting. LC offered to assist and mom receptive. Baby latched after on and off at 1st and fed for 5 mins , off and relatched for another 5. mom aware she will be seen on MBU.) Age:26 hours  LC attempted to visit mom, but RN was in the room. Will follow up later.  Maternal Data Has patient been taught Hand Expression?: Yes (drops noted) Does the patient have breastfeeding experience prior to this delivery?: Yes How long did the patient breastfeed?: per mom 2 months - was breast / formula  Feeding Mother's Current Feeding Choice: Breast Milk and Formula  LATCH Score Latch: Grasps breast easily, tongue down, lips flanged, rhythmical sucking.  Audible Swallowing: None  Type of Nipple: Everted at rest and after stimulation  Comfort (Breast/Nipple): Soft / non-tender  Hold (Positioning): Assistance needed to correctly position infant at breast and maintain latch.  LATCH Score: 7   Lactation Tools Discussed/Used    Interventions Interventions: Breast feeding basics reviewed  Discharge    Consult Status Consult Status: Follow-up from L&D Date: 04/04/22 Follow-up type: In-patient    Delene Loll 04/04/2022, 1:55 PM

## 2022-04-04 NOTE — Lactation Note (Signed)
This note was copied from a baby's chart. Lactation Consultation Note  Patient Name: Rebecca Salazar Today's Date: 04/04/2022 Reason for consult: L&D Initial assessment;Term;Breastfeeding assistance;Other (Comment) (LCL/D visit 30 mins PP. baby STS on moms chest and wide awake rooting. LC offered to assist and mom receptive. Baby latched after on and off at 1st and fed for 5 mins , off and relatched for another 5. mom aware she will be seen on MBU.) - Latch score 6  Age:27 mins PP .    Maternal Data Has patient been taught Hand Expression?: Yes (drops noted) Does the patient have breastfeeding experience prior to this delivery?: Yes How long did the patient breastfeed?: per mom 2 months - was breast / formula  Feeding Mother's Current Feeding Choice: Breast Milk and Formula  LATCH Score Latch: Repeated attempts needed to sustain latch, nipple held in mouth throughout feeding, stimulation needed to elicit sucking reflex.  Audible Swallowing: None  Type of Nipple: Everted at rest and after stimulation (areola compressible)  Comfort (Breast/Nipple): Soft / non-tender  Hold (Positioning): Assistance needed to correctly position infant at breast and maintain latch.  LATCH Score: 6   Lactation Tools Discussed/Used    Interventions Interventions: Breast feeding basics reviewed;Assisted with latch;Skin to skin;Hand express;Adjust position;Position options;Education  Discharge    Consult Status Consult Status: Follow-up from L&D Date: 04/04/22 Follow-up type: In-patient    Matilde Sprang Deklen Popelka 04/04/2022, 12:08 PM

## 2022-04-04 NOTE — Anesthesia Postprocedure Evaluation (Signed)
Anesthesia Post Note  Patient: Rebecca Salazar  Procedure(s) Performed: AN AD HOC LABOR EPIDURAL     Patient location during evaluation: Mother Baby Anesthesia Type: Epidural Level of consciousness: awake and alert Pain management: pain level controlled Vital Signs Assessment: post-procedure vital signs reviewed and stable Respiratory status: spontaneous breathing, nonlabored ventilation and respiratory function stable Cardiovascular status: stable Postop Assessment: no headache, no backache and epidural receding Anesthetic complications: no   No notable events documented.  Last Vitals:  Vitals:   04/04/22 1309 04/04/22 1400  BP: 130/85 130/85  Pulse: 78 79  Resp: 18 17  Temp: 36.7 C 36.7 C  SpO2: 100% 100%    Last Pain:  Vitals:   04/04/22 1540  TempSrc:   PainSc: 1    Pain Goal:                   Khiem Gargis

## 2022-04-05 LAB — CBC
HCT: 28.4 % — ABNORMAL LOW (ref 36.0–46.0)
Hemoglobin: 8.9 g/dL — ABNORMAL LOW (ref 12.0–15.0)
MCH: 22.2 pg — ABNORMAL LOW (ref 26.0–34.0)
MCHC: 31.3 g/dL (ref 30.0–36.0)
MCV: 70.8 fL — ABNORMAL LOW (ref 80.0–100.0)
Platelets: 236 10*3/uL (ref 150–400)
RBC: 4.01 MIL/uL (ref 3.87–5.11)
RDW: 23 % — ABNORMAL HIGH (ref 11.5–15.5)
WBC: 19.5 10*3/uL — ABNORMAL HIGH (ref 4.0–10.5)
nRBC: 1 % — ABNORMAL HIGH (ref 0.0–0.2)

## 2022-04-05 LAB — BPAM RBC
Blood Product Expiration Date: 202308122359
Blood Product Expiration Date: 202308122359
ISSUE DATE / TIME: 202307121958
ISSUE DATE / TIME: 202307122342
Unit Type and Rh: 5100
Unit Type and Rh: 5100

## 2022-04-05 LAB — TYPE AND SCREEN
ABO/RH(D): O POS
Antibody Screen: NEGATIVE
Unit division: 0
Unit division: 0

## 2022-04-05 MED ORDER — POLYSACCHARIDE IRON COMPLEX 150 MG PO CAPS
150.0000 mg | ORAL_CAPSULE | Freq: Every day | ORAL | Status: DC
  Administered 2022-04-05 – 2022-04-06 (×2): 150 mg via ORAL
  Filled 2022-04-05 (×2): qty 1

## 2022-04-05 MED ORDER — MAGNESIUM OXIDE -MG SUPPLEMENT 400 (240 MG) MG PO TABS
400.0000 mg | ORAL_TABLET | Freq: Every day | ORAL | Status: DC
  Administered 2022-04-06: 400 mg via ORAL
  Filled 2022-04-05 (×2): qty 1

## 2022-04-05 NOTE — Progress Notes (Signed)
    PPD # 1 S/P NSVD  Live born female  Birth Weight: 7 lb 15.7 oz (3620 g) APGAR: 4, 8  Newborn Delivery   Birth date/time: 04/04/2022 11:22:00 Delivery type: Vaginal, Spontaneous     Baby name: Journey Delivering provider: LAW, CASSANDRA A  Episiotomy:None   Lacerations:None   Feeding: breast and bottle - reports not having success with prior pregnancy, desires breast and bottle. Reports and good latch, denies tenderness or nipple breakdown   Pain control at delivery: Epidural   S:  Reports feeling - ok, still fatigued              Tolerating po/ No nausea or vomiting             Bleeding is light             Pain controlled with acetaminophen and ibuprofen (OTC)             Up ad lib / ambulatory / voiding without difficulties   O:  A & O x 3, in no apparent distress              VS:  Vitals:   04/04/22 2350 04/05/22 0015 04/05/22 0205 04/05/22 0545  BP: 139/86 127/83 129/83 125/79  Pulse: 68 64 72 77  Resp: 16 16 16 18   Temp: 98.8 F (37.1 C) 98.7 F (37.1 C) 98.5 F (36.9 C) 98.3 F (36.8 C)  TempSrc: Oral Oral Oral Oral  SpO2: 100% 100% 100% 100%  Weight:      Height:        LABS:  Recent Labs    04/04/22 0758 04/04/22 1755  WBC 17.6* 18.3*  HGB 7.6* 6.8*  HCT 25.9* 23.2*  PLT 267 216    Blood type: --/--/O POS (07/11 2030)  Rubella: Immune (12/27 0000)   I&O: I/O last 3 completed shifts: In: 1006 [Blood:1006] Out: 525 [Urine:450; Blood:75]          No intake/output data recorded.  Vaccines: TDaP          up to date    Gen: AAO x 3, NAD  Abdomen: soft, non-tender, non-distended             Fundus: firm, non-tender, U-1  Perineum: intact, mild swelling  Lochia: small  Extremities: +1 non pitting pedal edema, no calf pain or tenderness    A/P: PPD # 1 25 y.o., 05-15-1986   Principal Problem:   Postpartum care following vaginal delivery 7/12 Active Problems:   Anemia affecting pregnancy in third trimester - IDA - Pt s/p IV iron infusion x1  and 2 units PRBC, feeling slightly better, still fatigued.    SVD (spontaneous vaginal delivery)   Encounter for induction of labor   Continue to monitor status of anemia post PRBC transfusion - check 12 hr CBC  Routine post partum orders   Possible discharge today based on response to transfusion per pt desire.   Breast / Bottle feeding - seen by lactation   9/12, BSN, RN, [facility] 04/05/2022, 10:26 AM

## 2022-04-05 NOTE — Lactation Note (Signed)
This note was copied from a baby's chart. Lactation Consultation Note  Patient Name: Girl Sumire Halbleib OEUMP'N Date: 04/05/2022 Reason for consult: Follow-up assessment;Mother's request;Difficult latch;Term;Breastfeeding assistance;Other (Comment) (Anemia ( blood transfusion)) Age:26 hours  Mom working on getting more depth on the breast. Compression stripes noted but no other signs of nipple trauma.  LC offered Mom new position to try cross cradle prone, to get more depth on the breast. Mom then supplement with EBM first followed by formula using pace bottle and slow flow nipple. BF supplementation guide provided.  Mom aware importance to post pump after each feeding for 15 mins with DEBP.  Mom hands free electric pump for home.   All questions answered at the end of the visit.  Maternal Data Has patient been taught Hand Expression?: Yes  Feeding Mother's Current Feeding Choice: Breast Milk and Formula  LATCH Score Latch: Repeated attempts needed to sustain latch, nipple held in mouth throughout feeding, stimulation needed to elicit sucking reflex.  Audible Swallowing: A few with stimulation  Type of Nipple: Everted at rest and after stimulation  Comfort (Breast/Nipple): Soft / non-tender  Hold (Positioning): Assistance needed to correctly position infant at breast and maintain latch.  LATCH Score: 7   Lactation Tools Discussed/Used Tools: Pump;Flanges Breast pump type: Double-Electric Breast Pump Pump Education: Setup, frequency, and cleaning;Milk Storage Reason for Pumping: increase stimulation Pumping frequency: post pump after each feeding for 15 mins  Interventions Interventions: Breast feeding basics reviewed;Assisted with latch;Skin to skin;Breast massage;Hand express;Breast compression;Adjust position;Support pillows;Position options;Expressed milk;DEBP;Education;Pace feeding;LC Psychologist, educational;Infant Driven Feeding Algorithm  education  Discharge Pump: DEBP  Consult Status Consult Status: Follow-up Date: 04/06/22    Vanden Fawaz  Nicholson-Springer 04/05/2022, 1:46 PM

## 2022-04-06 NOTE — Progress Notes (Signed)
    PPD # 2 S/P NSVD  Live born female  Birth Weight: 7 lb 15.7 oz (3620 g) APGAR: 4, 8  Newborn Delivery   Birth date/time: 04/04/2022 11:22:00 Delivery type: Vaginal, Spontaneous     Baby name: Journey Delivering provider: LAW, CASSANDRA A  Episiotomy:None   Lacerations:None   Feeding: breast and bottle - reports not having success with prior pregnancy, desires breast and bottle. Reports and good latch, denies tenderness or nipple breakdown   Pain control at delivery: Epidural   S:  Reports feeling - ok, still fatigued              Tolerating po/ No nausea or vomiting             Bleeding is light             Pain controlled with acetaminophen and ibuprofen (OTC)             Up ad lib / ambulatory / voiding without difficulties   O:  A & O x 3, in no apparent distress              VS:  Vitals:   04/05/22 1803 04/05/22 2025 04/05/22 2132 04/06/22 0615  BP: 112/72 134/81 130/83 124/74  Pulse: 70 67 64 76  Resp:  17 18 17   Temp:  97.9 F (36.6 C) 97.9 F (36.6 C) 98 F (36.7 C)  TempSrc:  Oral Oral Oral  SpO2:  100%  97%  Weight:      Height:        LABS:  Recent Labs    04/04/22 1755 04/05/22 1632  WBC 18.3* 19.5*  HGB 6.8* 8.9*  HCT 23.2* 28.4*  PLT 216 236     Blood type: --/--/O POS (07/11 2030)  Rubella: Immune (12/27 0000)   I&O: I/O last 3 completed shifts: In: 1006 [Blood:1006] Out: -           No intake/output data recorded.  Vaccines: TDaP          up to date    Gen: AAO x 3, NAD  Abdomen: soft, non-tender, non-distended             Fundus: firm, non-tender, U-1  Perineum: intact, mild swelling  Lochia: small  Extremities: +1 non pitting pedal edema, no calf pain or tenderness    A/P: PPD # 2 25 y.o., 05-15-1986   Principal Problem:   Postpartum care following vaginal delivery 7/12 Active Problems:   Anemia affecting pregnancy in third trimester - IDA - Pt s/p IV iron infusion x1 and 2 units PRBC, feeling slightly better, still  fatigued.    SVD (spontaneous vaginal delivery)   Encounter for induction of labor   Continue to monitor status of anemia post PRBC transfusion - check 12 hr CBC  Routine post partum orders   Possible discharge today   Breast / Bottle feeding - seen by lactation   9/12, MD 04/06/2022, 9:25 AM

## 2022-04-06 NOTE — Discharge Summary (Signed)
OB Discharge Summary  Patient Name: Rebecca Salazar DOB: 04/23/1996 MRN: [id number]  Date of admission: 04/03/2022 Delivering provider: LAW, Elonda Husky A   Admitting diagnosis: Encounter for induction of labor [Z34.90] Intrauterine pregnancy: [redacted]w[redacted]d     Secondary diagnosis: Patient Active Problem List   Diagnosis Date Noted   Postpartum care following vaginal delivery 7/12 04/04/2022   Encounter for induction of labor 04/03/2022   Anemia affecting pregnancy in third trimester - IDA 09/01/2016   SVD (spontaneous vaginal delivery) 09/01/2016   Additional problems:na   Date of discharge: 04/06/2022   Discharge diagnosis: Principal Problem:   Postpartum care following vaginal delivery 7/12 Active Problems:   Anemia affecting pregnancy in third trimester - IDA   SVD (spontaneous vaginal delivery)   Encounter for induction of labor                                                              Post partum procedures:blood transfusion  Augmentation: N/A Pain control: Epidural  Laceration:None  Episiotomy:None  Complications: None  Hospital course:  Induction of Labor With Vaginal Delivery   26 y.o. yo [phone] at [redacted]w[redacted]d was admitted to the hospital 04/03/2022 for induction of labor.  Indication for induction: Favorable cervix at term.  Patient had an uncomplicated labor course as follows: Membrane Rupture Time/Date: 9:40 AM ,04/04/2022   Delivery Method:Vaginal, Spontaneous  Episiotomy: None  Lacerations:  None  Details of delivery can be found in separate delivery note.  Patient had a routine postpartum course. Patient is discharged home 04/06/22.  Newborn Data: Birth date:04/04/2022  Birth time:11:22 AM  Gender:Female  Living status:Living  Apgars:4 ,8  Weight:3620 g   Physical exam  Vitals:   04/05/22 1803 04/05/22 2025 04/05/22 2132 04/06/22 0615  BP: 112/72 134/81 130/83 124/74  Pulse: 70 67 64 76  Resp:  17 18 17   Temp:  97.9 F (36.6 C) 97.9 F (36.6 C) 98  F (36.7 C)  TempSrc:  Oral Oral Oral  SpO2:  100%  97%  Weight:      Height:       General: alert, cooperative, and no distress Lochia: appropriate Uterine Fundus: firm Incision: Healing well with no significant drainage Perineum: repair an, na edema DVT Evaluation: No evidence of DVT seen on physical exam. Labs: Lab Results  Component Value Date   WBC 19.5 (H) 04/05/2022   HGB 8.9 (L) 04/05/2022   HCT 28.4 (L) 04/05/2022   MCV 70.8 (L) 04/05/2022   PLT 236 04/05/2022      Latest Ref Rng & Units 08/26/2021    7:41 PM  CMP  Glucose 70 - 99 mg/dL 86   BUN 6 - 20 mg/dL 7   Creatinine 14/11/2020 - 7.10 mg/dL 6.26   Sodium 9.48 - 546 mmol/L 133   Potassium 3.5 - 5.1 mmol/L 3.6   Chloride 98 - 111 mmol/L 100   CO2 22 - 32 mmol/L 24   Calcium 8.9 - 10.3 mg/dL 9.3   Total Protein 6.5 - 8.1 g/dL 7.6   Total Bilirubin 0.3 - 1.2 mg/dL 0.4   Alkaline Phos 38 - 126 U/L 68   AST 15 - 41 U/L 20   ALT 0 - 44 U/L 12       04/04/2022    2:00 PM  Edinburgh Postnatal Depression Scale Screening Tool  I have been able to laugh and see the funny side of things. 0  I have looked forward with enjoyment to things. 0  I have blamed myself unnecessarily when things went wrong. 0  I have been anxious or worried for no good reason. 0  I have felt scared or panicky for no good reason. 0  Things have been getting on top of me. 0  I have been so unhappy that I have had difficulty sleeping. 0  I have felt sad or miserable. 0  I have been so unhappy that I have been crying. 0  The thought of harming myself has occurred to me. 0  Edinburgh Postnatal Depression Scale Total 0   Vaccines: TDaP          na   Discharge instruction:  per After Visit Summary,  [facility] OB booklet and  "Understanding Mother & Baby Care" hospital booklet  After Visit Meds:    Diet: routine diet  Activity: Advance as tolerated. Pelvic rest for 6 weeks.   Postpartum contraception: TBA in office  Newborn Data: Live  born female  Birth Weight: 7 lb 15.7 oz (3620 g) APGAR: 4, 8  Newborn Delivery   Birth date/time: 04/04/2022 11:22:00 Delivery type: Vaginal, Spontaneous      named na Baby Feeding: Breast Disposition:home with mother Circumcision: na   Delivery Report:  Review the Delivery Report for details.    Follow up:     Signed: Milbert Coulter, MSN 04/06/2022, 9:27 AM

## 2022-04-06 NOTE — Lactation Note (Signed)
This note was copied from a baby's chart. Lactation Consultation Note  Patient Name: Rebecca Salazar Today's Date: 04/06/2022 Reason for consult: Follow-up assessment Age:26 hours  Mom is no longer interested in putting infant to the breast. Wants to pump & BO. Mom had been pumping with a size 21 and a size 24 flange. I observed Mom pumping with size 21 flanges on both breasts with good results. Mom knows she can increase to size 24, as needed. Mom was encouraged to pump whenever infant gets a bottle.   Signs of fast bottle flow were discussed. Mom now knows how to hand express.  Mom had no other questions  Maternal Data Has patient been taught Hand Expression?: Yes  Feeding Nipple Type: Slow - flow   Lactation Tools Discussed/Used Tools: Pump;Flanges Flange Size: 21 Breast pump type: Double-Electric Breast Pump;Manual  Interventions Interventions: Education;DEBP;Hand express  Discharge Pump: Hands Free;Manual WIC Program: Yes  Consult Status Consult Status: Complete    Remigio Eisenmenger 04/06/2022, 12:43 PM

## 2022-04-12 ENCOUNTER — Telehealth (HOSPITAL_COMMUNITY): Payer: Self-pay | Admitting: *Deleted

## 2022-04-12 NOTE — Telephone Encounter (Signed)
Mom reports feeling good. No concerns about herself at this time. EPDS=2 [facility] score=0) Mom reports baby is doing well. Feeding, peeing, and pooping without difficulty. Safe sleep reviewed. Mom reports no concerns about baby at present.  Duffy Rhody, RN 04-12-2022 at 12:22pm

## 2022-09-24 NOTE — L&D Delivery Note (Signed)
Delivery Note At 10:16 AM a viable female was delivered via Vaginal, Spontaneous (Presentation: Right Occiput Anterior).  APGAR: 7, 8; weight pending. Placenta status: Spontaneous;Expressed, Intact.  Cord: 3 vessels with the following complications: None.  Cord pH: n/a  Anesthesia: Epidural Episiotomy: None Lacerations: None Suture Repair:  n/a Est. Blood Loss (mL): 1000  Mom to postpartum.  Baby to Couplet care / Skin to Skin.  Jackie Plum 04/25/2023, 11:14 AM  NVD NOTE Date:  04/25/2023  Delivering Physician:  Reesa Chew DO Anesthesia:   1.  Laboring epidural  Pre-Delivery Diagnosis:   1.  IUP at  41 0/[redacted] weeks gestation 2.  Active labor   Post-Delivery Diagnosis:   Same  Delivery of liveborn female neonate  Procedure:  Spontaneous vaginal delivery  QBL: 1000 mL  Complications:  None  LABOR AND DELIVERY SUMMARY The patient is a 27yo G7 [id number] at 41  0/[redacted] weeks gestation who presented to Labor and Delivery  in active labor.  She was admitted, placed on continuous EFM and toco.  Her labor course was managed expectantly.  She had AROM with clear fluid at 0903.  She received an epidural for pain control.   The patient progressed to completely effaced and dilated at 0903.  Patient labored down until she had an urge to push and pushing began at 0930.  A liveborn female neonate was delivered via spontaneous vaginal delivery over an intact perineum from the OA position at 1016.  There was nuchal cord x2 and infant delivered through.  Spontaneous cry was noted.  No shoulder dystocia was encountered.  Infant was placed on the maternal abdomen immediately following delivery.  Oropharynx and nasopharynx were bulb suctioned immediately following delivery.  Cord was doubly clamped and cut.  Cord blood obtained. Pitocin was added to the patient's IV.  The placenta delivered spontaneously and apparently intact with a 3 vessel cord at 1019 and discarded.  Postpartum hemorrhage encountered due  to atony. Uterus become firm with fundal massage and routine PP pitocin. Cytotec 100 mcg PR administered in addition to Methergine 0.2 mg IM and TXA. Bleeding stable and VSS.  No perineal, cervical, vaginal, labial or periuretheral lacerations were identified.  All sponge, needle and instrument counts were correct at the end of the procedure.  The patient and baby remained in the LDR in stable condition. Dr. Hester Mates  was present for the entire delivery.  Baby Name: Jayce  Dr. Reesa Chew

## 2022-09-27 ENCOUNTER — Inpatient Hospital Stay (HOSPITAL_COMMUNITY)
Admission: AD | Admit: 2022-09-27 | Discharge: 2022-09-27 | Discharge status: Against Medical Advice | Payer: Medicaid Other | Attending: Obstetrics and Gynecology | Admitting: Obstetrics and Gynecology

## 2022-09-27 DIAGNOSIS — Z5321 Procedure and treatment not carried out due to patient leaving prior to being seen by health care provider: Secondary | ICD-10-CM | POA: Diagnosis not present

## 2022-09-27 LAB — POCT PREGNANCY, URINE: Preg Test, Ur: POSITIVE — AB

## 2022-09-27 NOTE — MAU Note (Signed)
Front desk called about 2040 to state pt was back. Unable to triage her at that time. Was going to call pt now for Triage and front desk stated pt had just left.

## 2022-09-27 NOTE — Progress Notes (Signed)
Pt called but not in lobby. Admission personnel stated pt walked out toward security lobby talking on the phone

## 2022-09-28 ENCOUNTER — Inpatient Hospital Stay (HOSPITAL_COMMUNITY): Payer: Medicaid Other

## 2022-09-28 ENCOUNTER — Other Ambulatory Visit: Payer: Self-pay | Admitting: Certified Nurse Midwife

## 2022-09-28 ENCOUNTER — Encounter (HOSPITAL_COMMUNITY): Payer: Self-pay | Admitting: *Deleted

## 2022-09-28 ENCOUNTER — Inpatient Hospital Stay (HOSPITAL_COMMUNITY)
Admission: AD | Admit: 2022-09-28 | Discharge: 2022-09-28 | Disposition: A | Discharge status: Home / Self Care | Payer: Medicaid Other | Attending: Family Medicine | Admitting: Family Medicine

## 2022-09-28 ENCOUNTER — Encounter: Payer: Self-pay | Admitting: Certified Nurse Midwife

## 2022-09-28 DIAGNOSIS — O418X10 Other specified disorders of amniotic fluid and membranes, first trimester, not applicable or unspecified: Secondary | ICD-10-CM | POA: Diagnosis not present

## 2022-09-28 DIAGNOSIS — W19XXXA Unspecified fall, initial encounter: Secondary | ICD-10-CM | POA: Diagnosis not present

## 2022-09-28 DIAGNOSIS — R109 Unspecified abdominal pain: Secondary | ICD-10-CM

## 2022-09-28 DIAGNOSIS — R1030 Lower abdominal pain, unspecified: Secondary | ICD-10-CM | POA: Insufficient documentation

## 2022-09-28 DIAGNOSIS — Y929 Unspecified place or not applicable: Secondary | ICD-10-CM | POA: Insufficient documentation

## 2022-09-28 DIAGNOSIS — Y939 Activity, unspecified: Secondary | ICD-10-CM | POA: Diagnosis not present

## 2022-09-28 DIAGNOSIS — O26891 Other specified pregnancy related conditions, first trimester: Secondary | ICD-10-CM | POA: Diagnosis present

## 2022-09-28 DIAGNOSIS — Z3A11 11 weeks gestation of pregnancy: Secondary | ICD-10-CM

## 2022-09-28 DIAGNOSIS — N760 Acute vaginitis: Secondary | ICD-10-CM

## 2022-09-28 DIAGNOSIS — O208 Other hemorrhage in early pregnancy: Secondary | ICD-10-CM | POA: Insufficient documentation

## 2022-09-28 DIAGNOSIS — O468X1 Other antepartum hemorrhage, first trimester: Secondary | ICD-10-CM

## 2022-09-28 LAB — CBC
HCT: 35.1 % — ABNORMAL LOW (ref 36.0–46.0)
Hemoglobin: 11.5 g/dL — ABNORMAL LOW (ref 12.0–15.0)
MCH: 27.6 pg (ref 26.0–34.0)
MCHC: 32.8 g/dL (ref 30.0–36.0)
MCV: 84.2 fL (ref 80.0–100.0)
Platelets: 388 10*3/uL (ref 150–400)
RBC: 4.17 MIL/uL (ref 3.87–5.11)
RDW: 16.8 % — ABNORMAL HIGH (ref 11.5–15.5)
WBC: 16.3 10*3/uL — ABNORMAL HIGH (ref 4.0–10.5)
nRBC: 0 % (ref 0.0–0.2)

## 2022-09-28 LAB — URINALYSIS, ROUTINE W REFLEX MICROSCOPIC
Bilirubin Urine: NEGATIVE
Glucose, UA: NEGATIVE mg/dL
Hgb urine dipstick: NEGATIVE
Ketones, ur: NEGATIVE mg/dL
Leukocytes,Ua: NEGATIVE
Nitrite: NEGATIVE
Protein, ur: NEGATIVE mg/dL
Specific Gravity, Urine: 1.017 (ref 1.005–1.030)
pH: 7 (ref 5.0–8.0)

## 2022-09-28 LAB — HCG, QUANTITATIVE, PREGNANCY: hCG, Beta Chain, Quant, S: 38084 m[IU]/mL — ABNORMAL HIGH (ref <5)

## 2022-09-28 LAB — WET PREP, GENITAL
Sperm: NONE SEEN
Trich, Wet Prep: NONE SEEN
WBC, Wet Prep HPF POC: >=10 — AB (ref <10)
Yeast Wet Prep HPF POC: NONE SEEN

## 2022-09-28 LAB — ABO/RH: ABO/RH(D): O POS

## 2022-09-28 MED ORDER — CYCLOBENZAPRINE HCL 10 MG PO TABS: 10.0000 mg | ORAL_TABLET | Freq: Two times a day (BID) | ORAL | 0 refills | Status: DC | PRN

## 2022-09-28 MED ORDER — METRONIDAZOLE 500 MG PO TABS: 500.0000 mg | ORAL_TABLET | Freq: Two times a day (BID) | ORAL | 0 refills | Status: DC

## 2022-09-28 NOTE — MAU Note (Signed)
.  Rebecca Salazar is a 27 y.o. at Unknown here in MAU reporting: that she fell in the shower yesterday at 5:30 pm/ has been having some abd pain and cramping ever since. Denies any vag bleeding or discharge. Came to MAU last night but did not want to wait so she left.  LMP: 07/12/2022 Onset of complaint: Yesterday Pain score: 5 Vitals:   09/28/22 1130  BP: (!) 143/97  Pulse: (!) 103  Resp: 18  Temp: 98.6 F (37 C)     FHT:n/a Lab orders placed from triage:  u/a

## 2022-09-28 NOTE — MAU Provider Note (Signed)
History     CSN: [id number]  Arrival date and time: 09/28/22 1038   None     Chief Complaint  Patient presents with   Fall   Abdominal Pain   Rebecca Salazar , a  27 y.o. [id number] at [redacted]w[redacted]d presents to MAU with complaints of lower abdomina cramping after a all yesterday. Patient states she was in the shower yesterday and fell on her abdomen. She fell on her abdomen and ever since has been having intermittent cramping every since. She is currently rating pain a 5/10. Denies attempting to relieve pain. She denies vaginal and urinary symptoms.          OB History     Gravida  7   Para  2   Term  2   Preterm  0   AB  4   Living  2      SAB  3   IAB  1   Ectopic  0   Multiple  0   Live Births  2           Past Medical History:  Diagnosis Date   Herpes     No past surgical history on file.  Family History  Problem Relation Age of Onset   Hypertension Father     Social History   Tobacco Use   Smoking status: Former    Packs/day: 0.50    Types: Cigarettes    Quit date: 12/23/2019    Years since quitting: 2.7   Smokeless tobacco: Never  Vaping Use   Vaping Use: Never used  Substance Use Topics   Alcohol use: Not Currently    Comment: not while preg   Drug use: Not Currently    Types: Marijuana    Comment: last use 2021    Allergies: No Known Allergies  Medications Prior to Admission  Medication Sig Dispense Refill Last Dose   acyclovir (ZOVIRAX) 400 MG tablet  (Patient not taking: Reported on 12/25/2021)      metoCLOPramide (REGLAN) 10 MG tablet Take 1 tablet (10 mg total) by mouth every 6 (six) hours. (Patient not taking: Reported on 11/27/2021) 30 tablet 0    Prenatal Vit-Fe Fumarate-FA (PREPLUS) 27-1 MG TABS Take 1 tablet by mouth daily. 30 tablet 13    valACYclovir (VALTREX) 500 MG tablet Take 500 mg by mouth 2 (two) times daily.       Review of Systems  Constitutional:  Negative for chills, fatigue and fever.  Eyes:   Negative for pain and visual disturbance.  Respiratory:  Negative for apnea, shortness of breath and wheezing.   Cardiovascular:  Negative for chest pain and palpitations.  Gastrointestinal:  Positive for abdominal pain. Negative for constipation, diarrhea, nausea and vomiting.  Genitourinary:  Positive for pelvic pain. Negative for difficulty urinating, dysuria, vaginal bleeding, vaginal discharge and vaginal pain.  Musculoskeletal:  Positive for back pain.  Neurological:  Negative for seizures, weakness and headaches.  Psychiatric/Behavioral:  Negative for suicidal ideas.    Physical Exam   Blood pressure (!) 143/97, pulse (!) 103, temperature 98.6 F (37 C), resp. rate 18, height 5\' 9"  (1.753 m), weight 98.4 kg, last menstrual period 07/12/2022, unknown if currently breastfeeding.  Physical Exam Vitals and nursing note reviewed.  Constitutional:      General: She is not in acute distress.    Appearance: Normal appearance.  HENT:     Head: Normocephalic.  Pulmonary:     Effort: Pulmonary effort is normal.  Abdominal:  Palpations: Abdomen is soft.     Tenderness: There is no abdominal tenderness.  Musculoskeletal:     Cervical back: Normal range of motion.  Skin:    General: Skin is warm and dry.     Capillary Refill: Capillary refill takes less than 2 seconds.  Neurological:     Mental Status: She is alert and oriented to person, place, and time.  Psychiatric:        Mood and Affect: Mood normal.     MAU Course  Procedures Orders Placed This Encounter  Procedures   Wet prep, genital   US OB Comp Less 14 Wks   Urinalysis, Routine w reflex microscopic Urine, Clean Catch   CBC   hCG, quantitative, pregnancy   Diet NPO time specified   ABO/Rh   Discharge patient   Results for orders placed or performed during the hospital encounter of 09/28/22 (from the past 24 hour(s))  ABO/Rh     Status: None   Collection Time: 09/28/22 11:45 AM  Result Value Ref Range    ABO/RH(D) O POS    No rh immune globuloin      NOT A RH IMMUNE GLOBULIN CANDIDATE, PT RH POSITIVE Performed at [facility] [address] [place], [place] [address]   CBC     Status: Abnormal   Collection Time: 09/28/22 11:47 AM  Result Value Ref Range   WBC 16.3 (H) 4.0 - 10.5 K/uL   RBC 4.17 3.87 - 5.11 MIL/uL   Hemoglobin 11.5 (L) 12.0 - 15.0 g/dL   HCT 35.1 (L) 36.0 - 46.0 %   MCV 84.2 80.0 - 100.0 fL   MCH 27.6 26.0 - 34.0 pg   MCHC 32.8 30.0 - 36.0 g/dL   RDW 16.8 (H) 11.5 - 15.5 %   Platelets 388 150 - 400 K/uL   nRBC 0.0 0.0 - 0.2 %  hCG, quantitative, pregnancy     Status: Abnormal   Collection Time: 09/28/22 11:47 AM  Result Value Ref Range   hCG, Beta Chain, Quant, S 38,084 (H) <5 mIU/mL  Wet prep, genital     Status: Abnormal   Collection Time: 09/28/22 11:59 AM   Specimen: Vaginal  Result Value Ref Range   Yeast Wet Prep HPF POC NONE SEEN NONE SEEN   Trich, Wet Prep NONE SEEN NONE SEEN   Clue Cells Wet Prep HPF POC PRESENT (A) NONE SEEN   WBC, Wet Prep HPF POC >=10 (A) <10   Sperm NONE SEEN   Urinalysis, Routine w reflex microscopic Urine, Clean Catch     Status: Abnormal   Collection Time: 09/28/22 12:00 PM  Result Value Ref Range   Color, Urine YELLOW YELLOW   APPearance HAZY (A) CLEAR   Specific Gravity, Urine 1.017 1.005 - 1.030   pH 7.0 5.0 - 8.0   Glucose, UA NEGATIVE NEGATIVE mg/dL   Hgb urine dipstick NEGATIVE NEGATIVE   Bilirubin Urine NEGATIVE NEGATIVE   Ketones, ur NEGATIVE NEGATIVE mg/dL   Protein, ur NEGATIVE NEGATIVE mg/dL   Nitrite NEGATIVE NEGATIVE   Leukocytes,Ua NEGATIVE NEGATIVE    MDM - CBC- patient hemodynamically stable not  bleeding. Hgb 11.5 and Platelet count 388. - Quant > 38000 consistent with 11 week fetus.  - Wet prep positive foe BV.  - UA hazy but otherwise normal.  - [place] results revealed a 11 week IUP with cardiac activity consistent with [redacted]weeks gestation. A small [place] noted.  - Plan to discharge home  Assessment and Plan   1. Fall, initial encounter   2. [redacted] weeks gestation of pregnancy   3. Subchorionic hematoma in first trimester, single or unspecified fetus   4. Abdominal cramping    - Reviewed that the uterus is still protected at this gestation.  - Also discussed a [facility] and provided bleeding precautions reviewed.  - Worsening signs and return precautions reviewed.  - Patient has an appointment scheduled with [facility]. Encouraged to keep appointment.  - Patient discharged home in stable condition.   - MyChart message sent to patient about BV and treatment,    Claudette Head, MSN CNM  09/28/2022, 3:08 PM

## 2022-10-01 LAB — GC/CHLAMYDIA PROBE AMP (~~LOC~~) NOT AT ARMC
Chlamydia: NEGATIVE
Comment: NEGATIVE
Comment: NORMAL
Neisseria Gonorrhea: NEGATIVE

## 2022-10-04 LAB — OB RESULTS CONSOLE HEPATITIS B SURFACE ANTIGEN: Hepatitis B Surface Ag: NEGATIVE

## 2022-10-04 LAB — OB RESULTS CONSOLE RUBELLA ANTIBODY, IGM: Rubella: IMMUNE

## 2022-10-09 DIAGNOSIS — E559 Vitamin D deficiency, unspecified: Secondary | ICD-10-CM | POA: Insufficient documentation

## 2022-10-12 ENCOUNTER — Other Ambulatory Visit: Payer: Self-pay

## 2022-11-08 ENCOUNTER — Other Ambulatory Visit: Payer: Self-pay | Admitting: Obstetrics and Gynecology

## 2022-11-08 ENCOUNTER — Telehealth: Payer: Self-pay | Admitting: Obstetrics and Gynecology

## 2022-11-08 ENCOUNTER — Encounter: Payer: Self-pay | Admitting: Obstetrics and Gynecology

## 2022-11-08 DIAGNOSIS — Z363 Encounter for antenatal screening for malformations: Secondary | ICD-10-CM

## 2022-11-23 ENCOUNTER — Other Ambulatory Visit: Payer: Self-pay

## 2022-11-27 DIAGNOSIS — A6000 Herpesviral infection of urogenital system, unspecified: Secondary | ICD-10-CM | POA: Insufficient documentation

## 2022-12-04 ENCOUNTER — Ambulatory Visit: Payer: Medicaid Other | Attending: Obstetrics and Gynecology | Admitting: *Deleted

## 2022-12-04 ENCOUNTER — Ambulatory Visit (HOSPITAL_BASED_OUTPATIENT_CLINIC_OR_DEPARTMENT_OTHER): Payer: Medicaid Other

## 2022-12-04 ENCOUNTER — Other Ambulatory Visit: Payer: Self-pay | Admitting: *Deleted

## 2022-12-04 VITALS — BP 116/70 | HR 84

## 2022-12-04 DIAGNOSIS — Z363 Encounter for antenatal screening for malformations: Secondary | ICD-10-CM | POA: Diagnosis present

## 2022-12-04 DIAGNOSIS — O99212 Obesity complicating pregnancy, second trimester: Secondary | ICD-10-CM

## 2022-12-04 DIAGNOSIS — D259 Leiomyoma of uterus, unspecified: Secondary | ICD-10-CM

## 2023-01-01 ENCOUNTER — Encounter: Payer: Self-pay | Admitting: *Deleted

## 2023-01-01 ENCOUNTER — Ambulatory Visit: Payer: Medicaid Other | Admitting: *Deleted

## 2023-01-01 ENCOUNTER — Ambulatory Visit: Payer: Medicaid Other

## 2023-01-01 ENCOUNTER — Other Ambulatory Visit: Payer: Medicaid Other

## 2023-01-01 ENCOUNTER — Ambulatory Visit: Payer: Medicaid Other | Attending: Obstetrics

## 2023-01-01 DIAGNOSIS — O99012 Anemia complicating pregnancy, second trimester: Secondary | ICD-10-CM

## 2023-01-01 DIAGNOSIS — D649 Anemia, unspecified: Secondary | ICD-10-CM

## 2023-01-01 DIAGNOSIS — O09292 Supervision of pregnancy with other poor reproductive or obstetric history, second trimester: Secondary | ICD-10-CM

## 2023-01-01 DIAGNOSIS — E669 Obesity, unspecified: Secondary | ICD-10-CM

## 2023-01-01 DIAGNOSIS — Z8279 Family history of other congenital malformations, deformations and chromosomal abnormalities: Secondary | ICD-10-CM

## 2023-01-01 DIAGNOSIS — Z3A24 24 weeks gestation of pregnancy: Secondary | ICD-10-CM

## 2023-01-01 DIAGNOSIS — O99212 Obesity complicating pregnancy, second trimester: Secondary | ICD-10-CM | POA: Insufficient documentation

## 2023-02-28 LAB — OB RESULTS CONSOLE RPR: RPR: NONREACTIVE

## 2023-03-26 ENCOUNTER — Inpatient Hospital Stay (HOSPITAL_COMMUNITY)
Admission: AD | Admit: 2023-03-26 | Discharge: 2023-03-26 | Disposition: A | Discharge status: Home / Self Care | Payer: Medicaid Other | Attending: Obstetrics and Gynecology | Admitting: Obstetrics and Gynecology

## 2023-03-26 ENCOUNTER — Encounter (HOSPITAL_COMMUNITY): Payer: Self-pay | Admitting: Obstetrics and Gynecology

## 2023-03-26 DIAGNOSIS — O98513 Other viral diseases complicating pregnancy, third trimester: Secondary | ICD-10-CM | POA: Insufficient documentation

## 2023-03-26 DIAGNOSIS — R102 Pelvic and perineal pain: Secondary | ICD-10-CM | POA: Diagnosis not present

## 2023-03-26 DIAGNOSIS — Z3A36 36 weeks gestation of pregnancy: Secondary | ICD-10-CM

## 2023-03-26 DIAGNOSIS — O26893 Other specified pregnancy related conditions, third trimester: Secondary | ICD-10-CM | POA: Diagnosis present

## 2023-03-26 DIAGNOSIS — N898 Other specified noninflammatory disorders of vagina: Secondary | ICD-10-CM

## 2023-03-26 LAB — WET PREP, GENITAL
Sperm: NONE SEEN
Trich, Wet Prep: NONE SEEN
WBC, Wet Prep HPF POC: >=10 — AB (ref <10)
Yeast Wet Prep HPF POC: NONE SEEN

## 2023-03-26 NOTE — MAU Provider Note (Signed)
Chief Complaint:  Rupture of Membranes and Abdominal Pain   Event Date/Time   First Provider Initiated Contact with Patient 03/26/23 2026     HPI: Rebecca Salazar is a 27 y.o. [id number] at 54w5dwho presents to maternity admissions reporting leaking small amounts of fluid.  Also having some pelvic pain when walking.   RN exam of fluid is negative. She reports good fetal movement, denies vaginal bleeding, vaginal itching/burning, urinary symptoms, h/a, dizziness, n/v, diarrhea, constipation or fever/chills.  Vaginal Discharge The patient's primary symptoms include pelvic pain and vaginal discharge. The patient's pertinent negatives include no genital itching, genital odor or vaginal bleeding. This is a new problem. The problem has been unchanged. Associated symptoms include abdominal pain and back pain. Pertinent negatives include no constipation or diarrhea. The vaginal discharge was clear. There has been no bleeding. She has not been passing clots. She has not been passing tissue.   RN Note: Rebecca Salazar is a 27 y.o. at [redacted]w[redacted]d here in MAU reporting leaking fld since yesterday but started out as small amt. First started about 0940. Leaking has increased today. Today seeing some yellow color in her d/c on panties. Good FM . Pain with walking like "I am going to explode". No recent sve.  Onset of complaint: Monday Pain score: 6  Past Medical History: Past Medical History:  Diagnosis Date   Herpes     Past obstetric history: OB History  Gravida Para Term Preterm AB Living  7 2 2  0 4 2  SAB IAB Ectopic Multiple Live Births  3 1 0 0 2    # Outcome Date GA Lbr Len/2nd Weight Sex Delivery Anes PTL Lv  7 Current           6 Term 04/04/22 [redacted]w[redacted]d 03:13 / 00:09 3620 g F Vag-Spont EPI  LIV  5 SAB 03/2021          4 SAB 10/25/20 [redacted]w[redacted]d         3 IAB 2021          2 Term 09/01/16 [redacted]w[redacted]d 09:36 / 00:21 3525 g F Vag-Spont EPI  LIV     Birth Comments: none  1 SAB              Past Surgical History: History reviewed. No pertinent surgical history.  Family History: Family History  Problem Relation Age of Onset   Cancer Mother    Cancer Father    Hypertension Father     Social History: Social History   Tobacco Use   Smoking status: Former    Packs/day: .5    Types: Cigarettes    Quit date: 12/23/2019    Years since quitting: 3.2   Smokeless tobacco: Never  Vaping Use   Vaping Use: Never used  Substance Use Topics   Alcohol use: Not Currently    Comment: not while preg   Drug use: Not Currently    Types: Marijuana    Comment: last use 2021    Allergies: No Known Allergies  Meds:  Medications Prior to Admission  Medication Sig Dispense Refill Last Dose   Prenatal Vit-Fe Fumarate-FA (PREPLUS) 27-1 MG TABS Take 1 tablet by mouth daily. 30 tablet 13 03/26/2023   acyclovir (ZOVIRAX) 400 MG tablet  (Patient not taking: Reported on 12/04/2022)      cyclobenzaprine (FLEXERIL) 10 MG tablet Take 1 tablet (10 mg total) by mouth 2 (two) times daily as needed for muscle spasms. 20 tablet 0    valACYclovir (VALTREX) 500  MG tablet Take 500 mg by mouth 2 (two) times daily.       I have reviewed patient's Past Medical Hx, Surgical Hx, Family Hx, Social Hx, medications and allergies.   ROS:  Review of Systems  Gastrointestinal:  Positive for abdominal pain. Negative for constipation and diarrhea.  Genitourinary:  Positive for pelvic pain and vaginal discharge.  Musculoskeletal:  Positive for back pain.   Other systems negative  Physical Exam  Patient Vitals for the past 24 hrs:  BP Temp Pulse Resp SpO2 Height Weight  03/26/23 1946 124/75 -- -- -- -- -- --  03/26/23 1944 -- 98.4 F (36.9 C) 95 18 99 % 5\' 9"  (1.753 m) 108.9 kg   Constitutional: Well-developed, well-nourished female in no acute distress.  Cardiovascular: normal rate and rhythm Respiratory: normal effort, clear to auscultation bilaterally GI: Abd soft, non-tender, gravid appropriate  for gestational age.   No rebound or guarding. MS: Extremities nontender, no edema, normal ROM Neurologic: Alert and oriented x 4.  GU: Neg CVAT.  PELVIC EXAM: Cervix pink, visually closed, without lesion, Mucoid and white creamy discharge, vaginal walls and external genitalia normal  No evidence of ruptured membranes  Dilation: 1 Station: -3, Ballotable Presentation: Vertex Exam by:: Wynelle Bourgeois, CNM  FHT:  Baseline 145 , moderate variability, accelerations present, no decelerations Contractions: Occasional    Labs: Results for orders placed or performed during the hospital encounter of 03/26/23 (from the past 24 hour(s))  Wet prep, genital     Status: Abnormal   Collection Time: 03/26/23  8:20 PM  Result Value Ref Range   Yeast Wet Prep HPF POC NONE SEEN NONE SEEN   Trich, Wet Prep NONE SEEN NONE SEEN   Clue Cells Wet Prep HPF POC PRESENT (A) NONE SEEN   WBC, Wet Prep HPF POC >=10 (A) <10   Sperm NONE SEEN     --/--/O POS (01/05 1145)  Imaging:  No results found.  MAU Course/MDM: I have reviewed the triage vital signs and the nursing notes.   Pertinent labs & imaging results that were available during my care of the patient were reviewed by me and considered in my medical decision making (see chart for details).      I have reviewed her medical records including past results, notes and treatments.   I have ordered labs and reviewed results.  NST reviewed, reassuring Treatments in MAU included EFM.    Assessment: [place] IUP at [redacted]w[redacted]d Vaginal discharge Pelvic discomfort related to joint mobility pre-labor  Plan: Discharge home, Patient left before we could finish assessing her NST, though what we observed was reassuring. Labor precautions and fetal kick counts Follow up in Office for prenatal visits and recheck Encouraged to return if she develops worsening of symptoms, increase in pain, fever, or other concerning symptoms.   Pt stable at time of  discharge.  Wynelle Bourgeois CNM, MSN Certified Nurse-Midwife 03/26/2023 8:26 PM

## 2023-03-26 NOTE — MAU Note (Signed)
.  Rebecca Salazar is a 27 y.o. at [redacted]w[redacted]d here in MAU reporting leaking fld since yesterday but started out as small amt. First started about 0940. Leaking has increased today. Today seeing some yellow color in her d/c on panties. Good FM . Pain with walking like "I am going to explode". No recent sve.  Onset of complaint: Monday Pain score: 6 Vitals:   03/26/23 1944 03/26/23 1946  BP:  124/75  Pulse: 95   Resp: 18   Temp: 98.4 F (36.9 C)   SpO2: 99%      FHT:156 Lab orders placed from triage:  mau labor eval

## 2023-04-25 ENCOUNTER — Inpatient Hospital Stay (HOSPITAL_COMMUNITY): Payer: Medicaid Other | Admitting: Anesthesiology

## 2023-04-25 ENCOUNTER — Inpatient Hospital Stay (HOSPITAL_COMMUNITY)
Admission: AD | Admit: 2023-04-25 | Discharge: 2023-04-27 | DRG: 806 | Disposition: A | Discharge status: Home / Self Care | Payer: Medicaid Other | Attending: Obstetrics and Gynecology | Admitting: Obstetrics and Gynecology

## 2023-04-25 ENCOUNTER — Encounter (HOSPITAL_COMMUNITY): Payer: Self-pay | Admitting: Obstetrics and Gynecology

## 2023-04-25 ENCOUNTER — Other Ambulatory Visit: Payer: Self-pay

## 2023-04-25 DIAGNOSIS — A6000 Herpesviral infection of urogenital system, unspecified: Secondary | ICD-10-CM | POA: Diagnosis present

## 2023-04-25 DIAGNOSIS — O480 Post-term pregnancy: Secondary | ICD-10-CM | POA: Diagnosis present

## 2023-04-25 DIAGNOSIS — Z3A41 41 weeks gestation of pregnancy: Secondary | ICD-10-CM

## 2023-04-25 DIAGNOSIS — Z87891 Personal history of nicotine dependence: Secondary | ICD-10-CM

## 2023-04-25 DIAGNOSIS — O9832 Other infections with a predominantly sexual mode of transmission complicating childbirth: Secondary | ICD-10-CM | POA: Diagnosis present

## 2023-04-25 LAB — TYPE AND SCREEN
ABO/RH(D): O POS
Antibody Screen: NEGATIVE

## 2023-04-25 LAB — CBC
HCT: 33.3 % — ABNORMAL LOW (ref 36.0–46.0)
Hemoglobin: 10.4 g/dL — ABNORMAL LOW (ref 12.0–15.0)
MCH: 24.4 pg — ABNORMAL LOW (ref 26.0–34.0)
MCHC: 31.2 g/dL (ref 30.0–36.0)
MCV: 78 fL — ABNORMAL LOW (ref 80.0–100.0)
Platelets: 244 10*3/uL (ref 150–400)
RBC: 4.27 MIL/uL (ref 3.87–5.11)
RDW: 18 % — ABNORMAL HIGH (ref 11.5–15.5)
WBC: 14 10*3/uL — ABNORMAL HIGH (ref 4.0–10.5)
nRBC: 0.2 % (ref 0.0–0.2)

## 2023-04-25 LAB — RPR: RPR Ser Ql: NONREACTIVE

## 2023-04-25 LAB — HIV ANTIBODY (ROUTINE TESTING W REFLEX): HIV Screen 4th Generation wRfx: NONREACTIVE

## 2023-04-25 MED ORDER — ACETAMINOPHEN 325 MG PO TABS
650.0000 mg | ORAL_TABLET | ORAL | Status: DC | PRN
  Administered 2023-04-25: 650 mg via ORAL
  Filled 2023-04-25: qty 2

## 2023-04-25 MED ORDER — METHYLERGONOVINE MALEATE 0.2 MG/ML IJ SOLN
INTRAMUSCULAR | Status: AC
  Filled 2023-04-25: qty 1

## 2023-04-25 MED ORDER — PRENATAL MULTIVITAMIN CH
1.0000 | ORAL_TABLET | Freq: Every day | ORAL | Status: DC
  Administered 2023-04-25 – 2023-04-27 (×3): 1 via ORAL
  Filled 2023-04-25 (×3): qty 1

## 2023-04-25 MED ORDER — PHENYLEPHRINE 80 MCG/ML (10ML) SYRINGE FOR IV PUSH (FOR BLOOD PRESSURE SUPPORT): 80.0000 ug | PREFILLED_SYRINGE | INTRAVENOUS | Status: DC | PRN

## 2023-04-25 MED ORDER — FENTANYL-BUPIVACAINE-NACL 0.5-0.125-0.9 MG/250ML-% EP SOLN
EPIDURAL | Status: DC | PRN
  Administered 2023-04-25: 12 mL/h via EPIDURAL

## 2023-04-25 MED ORDER — FENTANYL-BUPIVACAINE-NACL 0.5-0.125-0.9 MG/250ML-% EP SOLN
12.0000 mL/h | EPIDURAL | Status: DC | PRN
  Filled 2023-04-25: qty 250

## 2023-04-25 MED ORDER — FLEET ENEMA 7-19 GM/118ML RE ENEM: 1.0000 | ENEMA | RECTAL | Status: DC | PRN

## 2023-04-25 MED ORDER — LACTATED RINGERS IV SOLN
INTRAVENOUS | Status: DC
  Administered 2023-04-25: 1000 mL via INTRAVENOUS

## 2023-04-25 MED ORDER — MISOPROSTOL 200 MCG PO TABS
ORAL_TABLET | ORAL | Status: AC
  Administered 2023-04-25: 1000 ug via VAGINAL
  Filled 2023-04-25: qty 5

## 2023-04-25 MED ORDER — OXYCODONE HCL 5 MG PO TABS
5.0000 mg | ORAL_TABLET | ORAL | Status: DC | PRN
  Administered 2023-04-25 – 2023-04-27 (×3): 5 mg via ORAL
  Filled 2023-04-25 (×3): qty 1

## 2023-04-25 MED ORDER — OXYCODONE HCL 5 MG PO TABS
10.0000 mg | ORAL_TABLET | ORAL | Status: DC | PRN
  Administered 2023-04-26: 10 mg via ORAL
  Filled 2023-04-25: qty 2

## 2023-04-25 MED ORDER — ONDANSETRON HCL 4 MG PO TABS: 4.0000 mg | ORAL_TABLET | ORAL | Status: DC | PRN

## 2023-04-25 MED ORDER — ZOLPIDEM TARTRATE 5 MG PO TABS: 5.0000 mg | ORAL_TABLET | Freq: Every evening | ORAL | Status: DC | PRN

## 2023-04-25 MED ORDER — OXYTOCIN BOLUS FROM INFUSION
333.0000 mL | Freq: Once | INTRAVENOUS | Status: AC
  Administered 2023-04-25: 333 mL via INTRAVENOUS

## 2023-04-25 MED ORDER — SOD CITRATE-CITRIC ACID 500-334 MG/5ML PO SOLN: 30.0000 mL | ORAL | Status: DC | PRN

## 2023-04-25 MED ORDER — DIPHENHYDRAMINE HCL 25 MG PO CAPS: 25.0000 mg | ORAL_CAPSULE | Freq: Four times a day (QID) | ORAL | Status: DC | PRN

## 2023-04-25 MED ORDER — LIDOCAINE HCL (PF) 1 % IJ SOLN: 30.0000 mL | INTRAMUSCULAR | Status: DC | PRN

## 2023-04-25 MED ORDER — ONDANSETRON HCL 4 MG/2ML IJ SOLN: 4.0000 mg | INTRAMUSCULAR | Status: DC | PRN

## 2023-04-25 MED ORDER — MISOPROSTOL 200 MCG PO TABS: 1000.0000 ug | ORAL_TABLET | Freq: Once | ORAL | Status: AC

## 2023-04-25 MED ORDER — OXYCODONE-ACETAMINOPHEN 5-325 MG PO TABS: 2.0000 | ORAL_TABLET | ORAL | Status: DC | PRN

## 2023-04-25 MED ORDER — ONDANSETRON HCL 4 MG/2ML IJ SOLN: 4.0000 mg | Freq: Four times a day (QID) | INTRAMUSCULAR | Status: DC | PRN

## 2023-04-25 MED ORDER — TRANEXAMIC ACID-NACL 1000-0.7 MG/100ML-% IV SOLN
INTRAVENOUS | Status: AC
  Filled 2023-04-25: qty 100

## 2023-04-25 MED ORDER — SIMETHICONE 80 MG PO CHEW: 80.0000 mg | CHEWABLE_TABLET | ORAL | Status: DC | PRN

## 2023-04-25 MED ORDER — TRANEXAMIC ACID-NACL 1000-0.7 MG/100ML-% IV SOLN: 1000.0000 mg | INTRAVENOUS | Status: AC

## 2023-04-25 MED ORDER — DIBUCAINE (PERIANAL) 1 % EX OINT: 1.0000 | TOPICAL_OINTMENT | CUTANEOUS | Status: DC | PRN

## 2023-04-25 MED ORDER — LACTATED RINGERS IV SOLN: 500.0000 mL | Freq: Once | INTRAVENOUS | Status: DC

## 2023-04-25 MED ORDER — ACETAMINOPHEN 325 MG PO TABS
650.0000 mg | ORAL_TABLET | ORAL | Status: DC | PRN
  Administered 2023-04-26 – 2023-04-27 (×2): 650 mg via ORAL
  Filled 2023-04-25 (×2): qty 2

## 2023-04-25 MED ORDER — DIPHENHYDRAMINE HCL 50 MG/ML IJ SOLN: 12.5000 mg | INTRAMUSCULAR | Status: DC | PRN

## 2023-04-25 MED ORDER — OXYCODONE-ACETAMINOPHEN 5-325 MG PO TABS: 1.0000 | ORAL_TABLET | ORAL | Status: DC | PRN

## 2023-04-25 MED ORDER — COCONUT OIL OIL: 1.0000 | TOPICAL_OIL | Status: DC | PRN

## 2023-04-25 MED ORDER — EPHEDRINE 5 MG/ML INJ: 10.0000 mg | INTRAVENOUS | Status: DC | PRN

## 2023-04-25 MED ORDER — SENNOSIDES-DOCUSATE SODIUM 8.6-50 MG PO TABS
2.0000 | ORAL_TABLET | Freq: Every day | ORAL | Status: DC
  Administered 2023-04-26 – 2023-04-27 (×2): 2 via ORAL
  Filled 2023-04-25 (×3): qty 2

## 2023-04-25 MED ORDER — BUPIVACAINE HCL (PF) 0.25 % IJ SOLN
INTRAMUSCULAR | Status: DC | PRN
  Administered 2023-04-25: 1 mL via INTRATHECAL

## 2023-04-25 MED ORDER — METHYLERGONOVINE MALEATE 0.2 MG/ML IJ SOLN
0.2000 mg | Freq: Once | INTRAMUSCULAR | Status: AC
  Administered 2023-04-25: 0.2 mg via INTRAMUSCULAR

## 2023-04-25 MED ORDER — WITCH HAZEL-GLYCERIN EX PADS
1.0000 | MEDICATED_PAD | CUTANEOUS | Status: DC | PRN
  Administered 2023-04-25: 1 via TOPICAL

## 2023-04-25 MED ORDER — LACTATED RINGERS IV SOLN: 500.0000 mL | INTRAVENOUS | Status: DC | PRN

## 2023-04-25 MED ORDER — OXYTOCIN-SODIUM CHLORIDE 30-0.9 UT/500ML-% IV SOLN
2.5000 [IU]/h | INTRAVENOUS | Status: DC
  Filled 2023-04-25: qty 500

## 2023-04-25 MED ORDER — IBUPROFEN 600 MG PO TABS
600.0000 mg | ORAL_TABLET | Freq: Four times a day (QID) | ORAL | Status: DC
  Administered 2023-04-25 – 2023-04-27 (×7): 600 mg via ORAL
  Filled 2023-04-25 (×8): qty 1

## 2023-04-25 MED ORDER — BENZOCAINE-MENTHOL 20-0.5 % EX AERO
1.0000 | INHALATION_SPRAY | CUTANEOUS | Status: DC | PRN
  Administered 2023-04-25: 1 via TOPICAL
  Filled 2023-04-25: qty 56

## 2023-04-25 NOTE — Anesthesia Preprocedure Evaluation (Signed)
Anesthesia Evaluation  Patient identified by MRN, date of birth, ID band Patient awake    Reviewed: Allergy & Precautions, Patient's Chart, lab work & pertinent test results  Airway Mallampati: II  TM Distance: >3 FB Neck ROM: Full    Dental no notable dental hx.    Pulmonary neg pulmonary ROS, former smoker   Pulmonary exam normal breath sounds clear to auscultation       Cardiovascular negative cardio ROS Normal cardiovascular exam Rhythm:Regular Rate:Normal     Neuro/Psych negative neurological ROS  negative psych ROS   GI/Hepatic negative GI ROS, Neg liver ROS,,,  Endo/Other  negative endocrine ROS    Renal/GU negative Renal ROS  negative genitourinary   Musculoskeletal negative musculoskeletal ROS (+)    Abdominal   Peds negative pediatric ROS (+)  Hematology  (+) Blood dyscrasia, anemia Hb 10.4, plt 244   Anesthesia Other Findings   Reproductive/Obstetrics (+) Pregnancy                             Anesthesia Physical Anesthesia Plan  ASA: 2  Anesthesia Plan: Combined Spinal and Epidural   Post-op Pain Management:    Induction:   PONV Risk Score and Plan: 2  Airway Management Planned: Natural Airway  Additional Equipment: None  Intra-op Plan:   Post-operative Plan:   Informed Consent: I have reviewed the patients History and Physical, chart, labs and discussed the procedure including the risks, benefits and alternatives for the proposed anesthesia with the patient or authorized representative who has indicated his/her understanding and acceptance.       Plan Discussed with:   Anesthesia Plan Comments: (Advanced cervical dilation, hx precipitous labors- will proceed w/ CSE)       Anesthesia Quick Evaluation

## 2023-04-25 NOTE — Progress Notes (Signed)
Rebecca Salazar is a 27 y.o. [id number] at [redacted]w[redacted]d  admitted for active labor  Subjective: Patient comfortable w/ epidural. Pt. 10/100/0 and AROM performed with clear fluid noted.  Objective: BP 110/72   Pulse 83   Temp 97.9 F (36.6 C) (Oral)   Resp 16   LMP 07/12/2022   SpO2 99%  I/O last 3 completed shifts: In: -  Out: 150 [Urine:150] No intake/output data recorded.  FHT:  FHR: 130 bpm, variability: moderate,  accelerations:  Present,  decelerations:  Present variable, overall reassuring UC:   regular, every 3-4 minutes SVE:   Dilation: 10 Effacement (%): 100 Station: 0, Plus 1 Exam by:: Hester Mates, MD  Labs: Lab Results  Component Value Date   WBC 14.0 (H) 04/25/2023   HGB 10.4 (L) 04/25/2023   HCT 33.3 (L) 04/25/2023   MCV 78.0 (L) 04/25/2023   PLT 244 04/25/2023    Assessment / Plan: Spontaneous labor, progressing normally  Labor: Progressing normally Preeclampsia:  no signs or symptoms of toxicity Fetal Wellbeing:  130 bpm, variability: moderate,  accelerations:  Present,  decelerations:  Present variable, overall very reassuring Pain Control:  Epidural I/D:  n/a Anticipated MOD:  NSVD  Pt. Will labor down then start pushing  Jackie Plum, MD 04/25/2023, 9:51 AM

## 2023-04-25 NOTE — MAU Note (Addendum)
.  Rebecca Salazar is a 27 y.o. at [redacted]w[redacted]d here in MAU reporting: contractions that began at 2222 tonight-Denies SROM, vaginal bleeding or bloody show.  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

## 2023-04-25 NOTE — H&P (Signed)
Rebecca Salazar is a 27 y.o. female presenting for labor. HPI: 27 y.o. [id number] @ [redacted]w[redacted]d estimated gestational age (as dated by LMP c/w 12 week ultrasound) presents for contractions. Cervix was 5/100/-2 on arrival. She has an epidural in place now.   Leakage of fluid:  No Vaginal bleeding:  No Contractions:  Yes Fetal movement:  Yes   Prenatal care has been provided by [facility].   ROS:  Denies fevers, chills, chest pain, visual changes, SOB, RUQ/epigastric pain, N/V, dysuria, hematuria, or sudden onset/worsening bilateral LE or facial edema.   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 Family History: family history includes Cancer in her father and mother; Hypertension in her father. Social History:  reports that she quit smoking about 3 years ago. Her smoking use included cigarettes. She has never used smokeless tobacco. She reports that she does not currently use alcohol. She reports that she does not currently use drugs after having used the following drugs: Marijuana.     Maternal Diabetes: No Genetic Screening: Normal low risk female Maternal Ultrasounds/Referrals: Normal Fetal Ultrasounds or other Referrals:  None Maternal Substance Abuse:  No Significant Maternal Medications:  None Significant Maternal Lab Results:  None Number of Prenatal Visits:greater than 3 verified prenatal visits Other Comments:  None  Review of Systems  Constitutional: Negative.   HENT: Negative.    Eyes: Negative.    Respiratory: Negative.    Cardiovascular: Negative.   Gastrointestinal: Negative.   Genitourinary: Negative.   All other systems reviewed and are negative.  Maternal Medical History:  Reason for admission: Contractions.   Contractions: Frequency: regular.   Duration is approximately 1 minute.   Perceived severity is strong.   Fetal activity: Perceived fetal activity is normal.     Dilation: 10 Effacement (%): 100 Station: 0, Plus 1 Exam by:: Markham Dumlao, MD Blood pressure 110/72, pulse 83, temperature 97.9 F (36.6 C), temperature source Oral, resp. rate 16, last menstrual period 07/12/2022, SpO2 99%, unknown if currently breastfeeding. Maternal Exam:  Uterine Assessment: Contraction strength is moderate.  Contraction duration is 1 minute. Contraction frequency is regular.  Abdomen: Patient reports no abdominal tenderness. Fetal presentation: vertex Introitus: Normal vulva. Vulva is negative for lesion.  No lesions Pelvis: adequate for delivery.   Cervix: Cervix evaluated by digital exam.     Fetal Exam Fetal Monitor Review: Baseline rate: 130.  Variability: moderate (6-25 bpm).   Pattern: accelerations present and no decelerations.   Fetal State Assessment: Category I - tracings are normal.   Physical Exam Vitals reviewed.  Constitutional:      Appearance: Normal appearance.  Cardiovascular:     Rate and Rhythm: Normal rate.  Pulmonary:     Effort: Pulmonary effort is normal. No respiratory distress.  Abdominal:     General: There is no distension.     Palpations:  Abdomen is soft.     Tenderness: There is no abdominal tenderness.     Comments: gravid  Genitourinary:    General: Normal vulva.  Vulva is no lesion.     Comments: No lesions on exam Skin:    General: Skin is warm and dry.  Neurological:     General: No focal deficit present.     Mental Status: She is alert and oriented to person, place, and time.  Psychiatric:        Mood and Affect: Mood  normal.     Prenatal labs: ABO, Rh: --/--/O POS (08/01 0353) Antibody: NEG (08/01 0353) Rubella:   immune 10/04/2022 RPR:   non reactive 02/28/2023 HBsAg:   negative 10/04/2022 HIV:   non reactive 02/28/2023 GBS:   unknown, no risk factors  Assessment/Plan: 27 y.o. 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

## 2023-04-25 NOTE — Anesthesia Procedure Notes (Signed)
Epidural Patient location during procedure: OB Start time: 04/25/2023 4:47 AM End time: 04/25/2023 4:56 AM  Staffing Anesthesiologist: Lannie Fields, DO Performed: anesthesiologist   Preanesthetic Checklist Completed: patient identified, IV checked, risks and benefits discussed, monitors and equipment checked, pre-op evaluation and timeout performed  Epidural Patient position: sitting Prep: DuraPrep and site prepped and draped Patient monitoring: continuous pulse ox, blood pressure, heart rate and cardiac monitor Approach: midline Location: L3-L4 Injection technique: LOR air  Needle:  Needle type: Tuohy  Needle gauge: 17 G Needle length: 9 cm Needle insertion depth: 6 cm Catheter type: closed end flexible Catheter size: 19 Gauge Catheter at skin depth: 11 cm Test dose: negative  Assessment Sensory level: T8 Events: blood not aspirated, no cerebrospinal fluid, injection not painful, no injection resistance, no paresthesia and negative IV test  Additional Notes Patient identified. Risks/Benefits/Options discussed with patient including but not limited to bleeding, infection, nerve damage, paralysis, failed block, incomplete pain control, headache, blood pressure changes, nausea, vomiting, reactions to medication both or allergic, itching and postpartum back pain. Confirmed with bedside nurse the patient's most recent platelet count. Confirmed with patient that they are not currently taking any anticoagulation, have any bleeding history or any family history of bleeding disorders. Patient expressed understanding and wished to proceed. All questions were answered. Sterile technique was used throughout the entire procedure. Please see nursing notes for vital signs. Test dose was given through epidural catheter and negative prior to continuing to dose epidural or start infusion. Warning signs of high block given to the patient including shortness of breath, tingling/numbness in  hands, complete motor block, or any concerning symptoms with instructions to call for help. Patient was given instructions on fall risk and not to get out of bed. All questions and concerns addressed with instructions to call with any issues or inadequate analgesia.    CSE performed with 24G pencan through tuohy, clear CSF, no issues.Reason for block:procedure for pain

## 2023-04-26 LAB — CBC
HCT: 29.2 % — ABNORMAL LOW (ref 36.0–46.0)
Hemoglobin: 8.8 g/dL — ABNORMAL LOW (ref 12.0–15.0)
MCH: 24 pg — ABNORMAL LOW (ref 26.0–34.0)
MCHC: 30.1 g/dL (ref 30.0–36.0)
MCV: 79.8 fL — ABNORMAL LOW (ref 80.0–100.0)
Platelets: 233 10*3/uL (ref 150–400)
RBC: 3.66 MIL/uL — ABNORMAL LOW (ref 3.87–5.11)
RDW: 18 % — ABNORMAL HIGH (ref 11.5–15.5)
WBC: 15.2 10*3/uL — ABNORMAL HIGH (ref 4.0–10.5)
nRBC: 0.2 % (ref 0.0–0.2)

## 2023-04-26 LAB — BIRTH TISSUE RECOVERY COLLECTION (PLACENTA DONATION)

## 2023-04-26 NOTE — Progress Notes (Signed)
PPD# 1 SVD w/ intact perineum Information for the patient's newborn:  Salli, Bodin [[id number]]  female   Circumcision Yes   S:   Reports feeling tired and sore and hopes to stay another night Tolerating PO fluid and solids No nausea or vomiting Bleeding is light, no clots Pain controlled with acetaminophen and ibuprofen (OTC) Up ad lib / ambulatory / voiding w/o difficulty Feeding: Breast    O:   VS: BP 116/65 (BP Location: Right Arm)   Pulse 62   Temp 98.4 F (36.9 C) (Oral)   Resp 16   Ht 5\' 9"  (1.753 m)   Wt 109 kg   LMP 07/12/2022   SpO2 95%   Breastfeeding Unknown   BMI 35.49 kg/m   LABS:  Recent Labs    04/25/23 0353 04/26/23 0424  WBC 14.0* 15.2*  HGB 10.4* 8.8*  PLT 244 233   Blood type: --/--/O POS (08/01 0353) Rubella: Immune (01/11 0000)                      I&O: Intake/Output      08/01 0701 08/02 0700 08/02 0701 08/03 0700   Urine (mL/kg/hr) 270 (0.1)    Emesis/NG output 1    Blood 1000    Total Output 1271    Net -1271         Urine Occurrence 1 x    Emesis Occurrence 1 x      Physical Exam: Alert and oriented X3 Lungs: Clear and unlabored Heart: regular rate and rhythm / no mumurs Abdomen: soft, non-tender, non-distended  Fundus: firm, non-tender, U-2 Perineum: intact, mildly edematous Lochia: appropriate, no clots Extremities: trace edema, no calf pain, tenderness, or cords    A:  PPD # 1  Normal exam  P:  Routine post partum orders Anticipate D/C on 04/27/23   Plan reviewed w/ Dr. Evorn Gong, DNP, CNM 04/26/2023, 3:30 PM

## 2023-04-26 NOTE — Anesthesia Postprocedure Evaluation (Signed)
Anesthesia Post Note  Patient: Rebecca Salazar  Procedure(s) Performed: AN AD HOC LABOR EPIDURAL     Patient location during evaluation: Mother Baby Anesthesia Type: Epidural Level of consciousness: awake and alert Pain management: pain level controlled Vital Signs Assessment: post-procedure vital signs reviewed and stable Respiratory status: spontaneous breathing, nonlabored ventilation and respiratory function stable Cardiovascular status: stable Postop Assessment: no headache, no backache and epidural receding Anesthetic complications: no   No notable events documented.  Last Vitals:  Vitals:   04/25/23 2300 04/26/23 0413  BP: 116/67 116/65  Pulse: 74 62  Resp: 18 16  Temp:    SpO2: 96% 95%    Last Pain:  Vitals:   04/26/23 0413  TempSrc:   PainSc: Asleep   Pain Goal:                   [name]

## 2023-04-27 MED ORDER — ACETAMINOPHEN 325 MG PO TABS: 650.0000 mg | ORAL_TABLET | ORAL | Status: AC | PRN

## 2023-04-27 MED ORDER — IBUPROFEN 600 MG PO TABS: 600.0000 mg | ORAL_TABLET | Freq: Four times a day (QID) | ORAL | 0 refills | Status: DC

## 2023-04-27 NOTE — Discharge Summary (Signed)
Postpartum Discharge Summary  Date of Service updated 04/27/23    Patient Name: Rebecca Salazar DOB: [birth year] MRN: [id number]  Date of admission: 04/25/2023 Delivery date:04/25/2023 Delivering provider: Reesa Chew D Date of discharge: 04/27/2023  Admitting diagnosis: Normal labor [O80, Z37.9] Intrauterine pregnancy: [redacted]w[redacted]d     Secondary diagnosis:  Principal Problem:   Vaginal delivery Active Problems:   Normal labor  Additional problems: none    Discharge diagnosis: Term Pregnancy Delivered                                              Post partum procedures: none Augmentation: AROM Complications: None  Hospital course: Onset of Labor With Vaginal Delivery      27 y.o. yo [id number] at [redacted]w[redacted]d was admitted in Active Labor on 04/25/2023. Labor course was uncomplicated  Membrane Rupture Time/Date: 9:02 AM,04/25/2023  Delivery Method:Vaginal, Spontaneous Operative Delivery:N/A Episiotomy: None Lacerations:  None Patient had a uncomplicated postpartum course.  She is ambulating, tolerating a regular diet, passing flatus, and urinating well. Patient is discharged home in stable condition on 04/27/23.  Newborn Data: Birth date:04/25/2023 Birth time:10:16 AM Gender:Female Living status:Living Apgars:7 ,8  Weight:3610 g  Magnesium Sulfate received: No BMZ received: No Rhophylac:N/A MMR:N/A Transfusion:No  Physical exam  Vitals:   04/26/23 0413 04/26/23 1545 04/26/23 2037 04/27/23 0639  BP: 116/65 120/74 126/74 114/74  Pulse: 62 80 90 74  Resp: 16 18 17    Temp:  98.1 F (36.7 C) 98.4 F (36.9 C) 98.2 F (36.8 C)  TempSrc:  Oral Oral Oral  SpO2: 95%  98% 100%  Weight:      Height:       General: alert, cooperative, and no distress Lochia: appropriate Uterine Fundus: firm Incision: N/A DVT Evaluation: No evidence of DVT seen on physical exam. No cords or calf tenderness. No significant calf/ankle edema. Labs: Lab Results  Component Value Date   WBC  15.2 (H) 04/26/2023   HGB 8.8 (L) 04/26/2023   HCT 29.2 (L) 04/26/2023   MCV 79.8 (L) 04/26/2023   PLT 233 04/26/2023      Latest Ref Rng & Units 08/26/2021    7:41 PM  CMP  Glucose 70 - 99 mg/dL 86   BUN 6 - 20 mg/dL 7   Creatinine 9.62 - 9.52 mg/dL 8.41   Sodium 324 - 401 mmol/L 133   Potassium 3.5 - 5.1 mmol/L 3.6   Chloride 98 - 111 mmol/L 100   CO2 22 - 32 mmol/L 24   Calcium 8.9 - 10.3 mg/dL 9.3   Total Protein 6.5 - 8.1 g/dL 7.6   Total Bilirubin 0.3 - 1.2 mg/dL 0.4   Alkaline Phos 38 - 126 U/L 68   AST 15 - 41 U/L 20   ALT 0 - 44 U/L 12    Edinburgh Score:    04/25/2023   12:20 PM  Edinburgh Postnatal Depression Scale Screening Tool  I have been able to laugh and see the funny side of things. 0  I have looked forward with enjoyment to things. 1  I have blamed myself unnecessarily when things went wrong. 0  I have been anxious or worried for no good reason. 2  I have felt scared or panicky for no good reason. 0  Things have been getting on top of me. 1  I have been so unhappy that  I have had difficulty sleeping. 1  I have felt sad or miserable. 1  I have been so unhappy that I have been crying. 1  The thought of harming myself has occurred to me. 0  Edinburgh Postnatal Depression Scale Total 7      After visit meds:  Allergies as of 04/27/2023   No Known Allergies      Medication List     TAKE these medications    acetaminophen 325 MG tablet Commonly known as: Tylenol Take 2 tablets (650 mg total) by mouth every 4 (four) hours as needed (for pain scale < 4).   ibuprofen 600 MG tablet Commonly known as: ADVIL Take 1 tablet (600 mg total) by mouth every 6 (six) hours.   PRENATAL PO Take 1 tablet by mouth daily.   valACYclovir 500 MG tablet Commonly known as: VALTREX Take 500 mg by mouth 2 (two) times daily.         Discharge home in stable condition Infant Feeding: Breast Infant Disposition:home with mother Discharge instruction: per After  Visit Summary and Postpartum booklet. Activity: Advance as tolerated. Pelvic rest for 6 weeks.  Diet: routine diet Anticipated Birth Control: Unsure and options in print provided Postpartum Appointment:6 weeks Additional Postpartum F/U:  none Future Appointments:No future appointments. Follow up Visit:  Follow-up Information     [facility] Follow up in 6 week(s).   Specialty: Obstetrics and Gynecology Contact information: [address] [place] [address] [phone]                    04/27/2023 Roma Schanz, CNM

## 2023-05-21 ENCOUNTER — Telehealth (HOSPITAL_COMMUNITY): Payer: Self-pay | Admitting: *Deleted

## 2023-05-21 NOTE — Telephone Encounter (Signed)
05/21/2023  Name: Rebecca Salazar MRN: [id number] DOB: April 23, 1996  Reason for Call:  Transition of Care Hospital Discharge Call  Contact Status: Patient Contact Status:  (incomplete, patient hung up part way through call)  Language assistant needed: Interpreter Mode: Interpreter Not Needed        Follow-Up Questions: Do You Have Any Concerns About Your Health As You Heal From Delivery?: No Do You Have Any Concerns About Your Infants Health?: No  Edinburgh Postnatal Depression Scale:  In the Past 7 Days:  See above  PHQ2-9 Depression Scale:     Discharge Follow-up:    Post-discharge interventions: NA  Rebecca Eaton 05/21/2023 13:16

## 2023-07-21 IMAGING — US US OB COMP LESS 14 WK
1 series · 15 of 25 positions shown · non-contrast
Comparison: More remote imaging from 7973.

CLINICAL DATA: A 25-year-old female presents with history of
cramping and the setting of positive pregnancy test. Quantitative
beta HCG is pending. Patient is 6 weeks 3 days gestational age as
estimated by last menstrual cycle.

EXAM:
OBSTETRIC <14 WK US AND TRANSVAGINAL OB US
TECHNIQUE: Both transabdominal and transvaginal ultrasound examinations were
performed for complete evaluation of the gestation as well as the
maternal uterus, adnexal regions, and pelvic cul-de-sac.
Transvaginal technique was performed to assess early pregnancy.

[Series 1: us ob comp less 14 wk · 25 acquisitions, 15 frames shown]
[im 1/25]
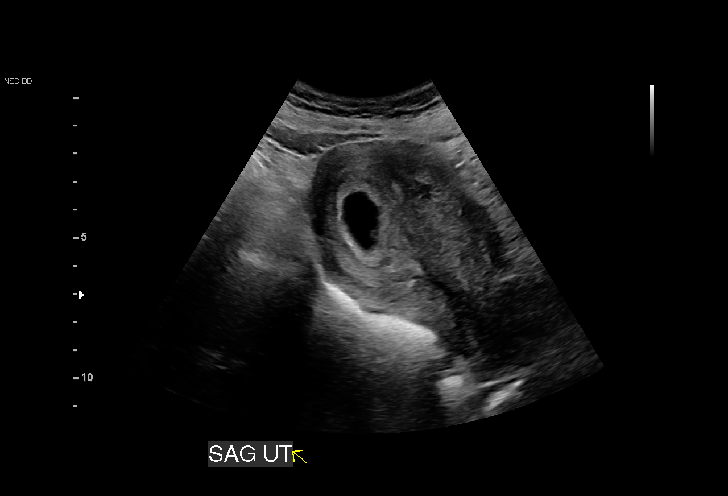
[im 3/25]
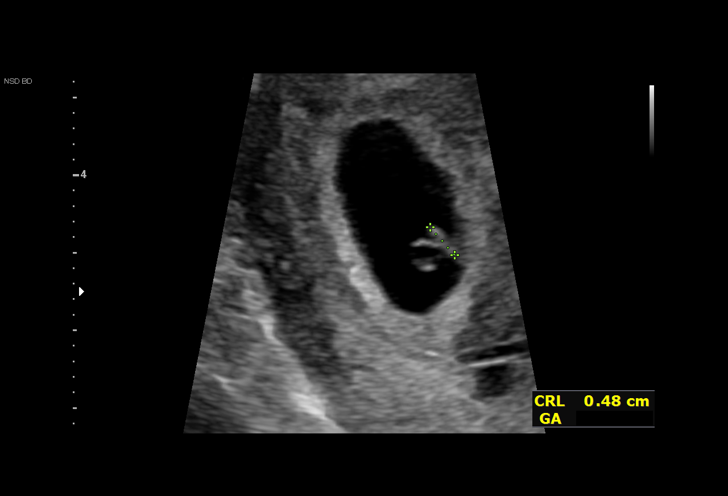
[im 5/25]
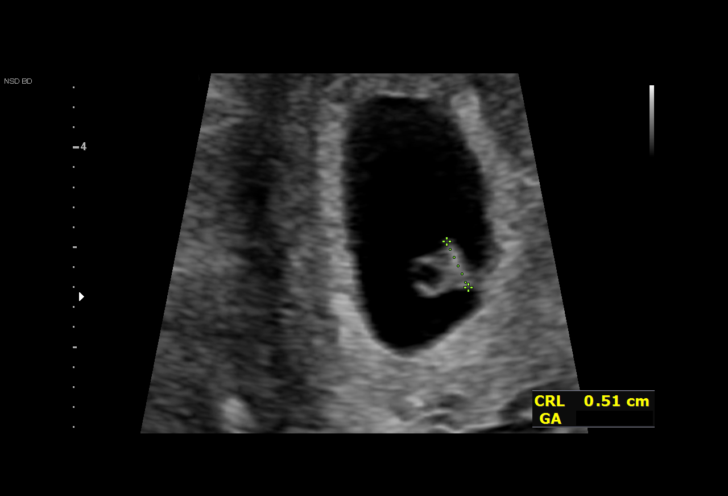
[im 6/25]
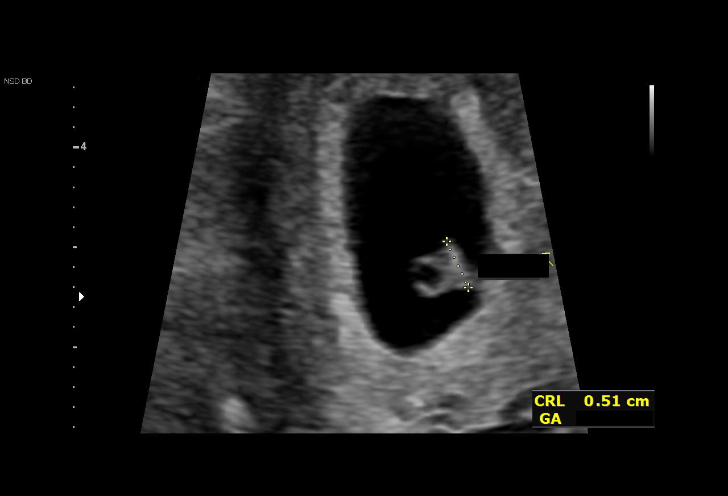
[im 8/25]
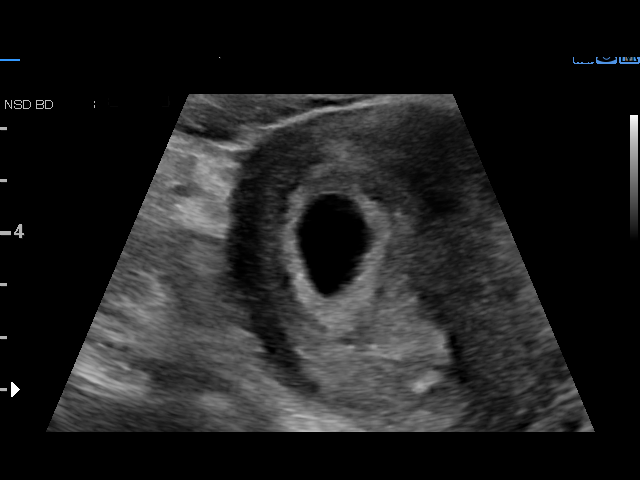
[im 10/25]
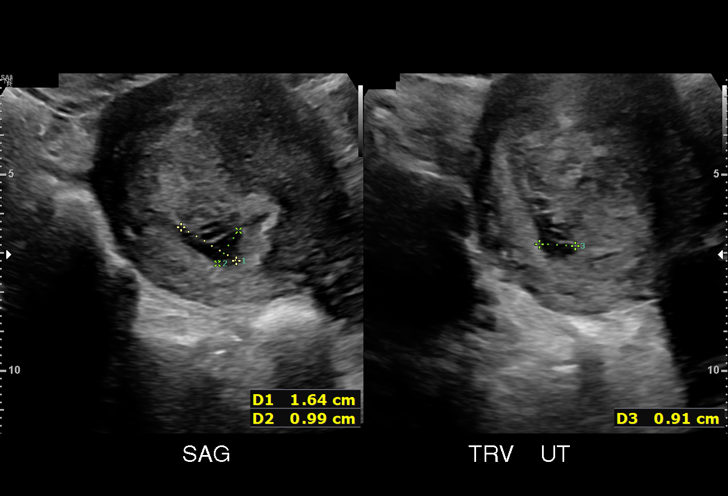
[im 11/25]
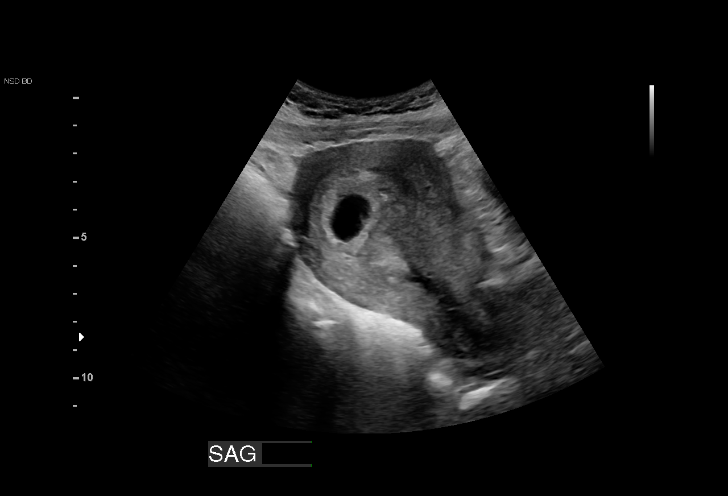
[im 13/25]
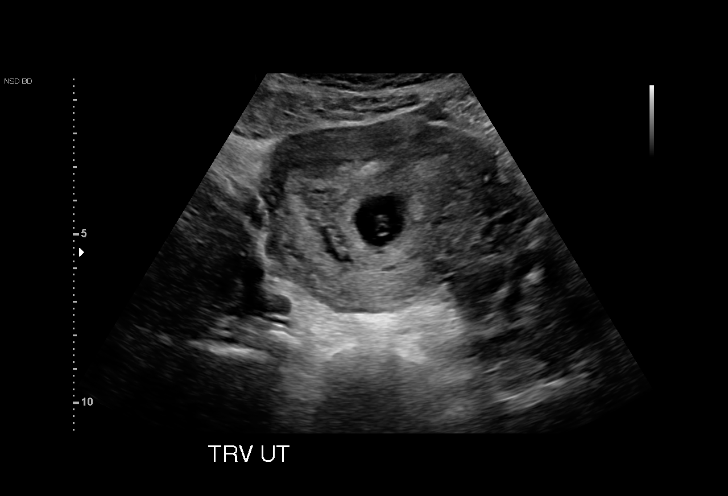
[im 15/25]
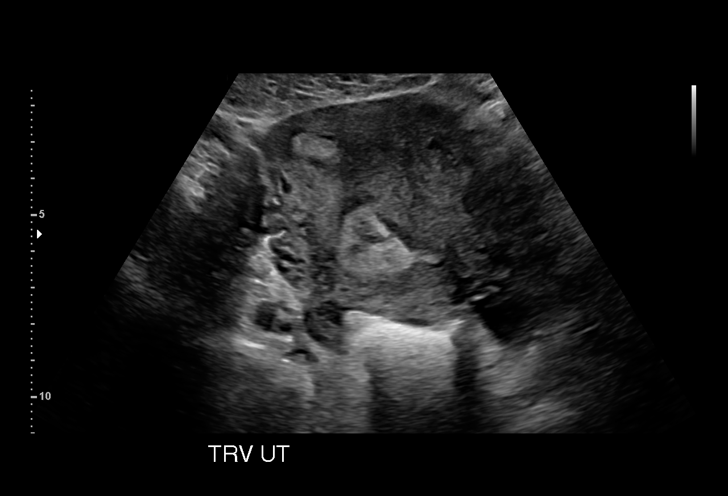
[im 16/25]
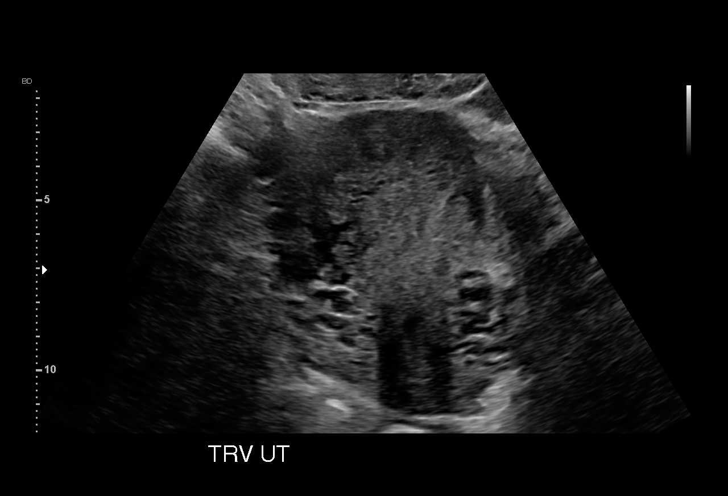
[im 18/25]
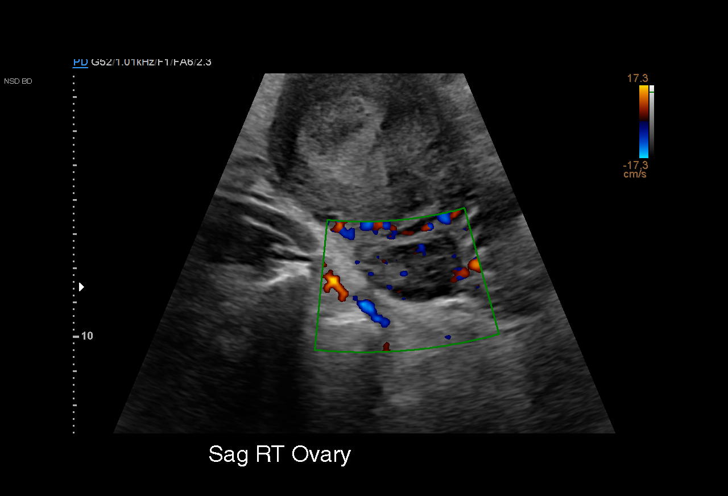
[im 20/25]
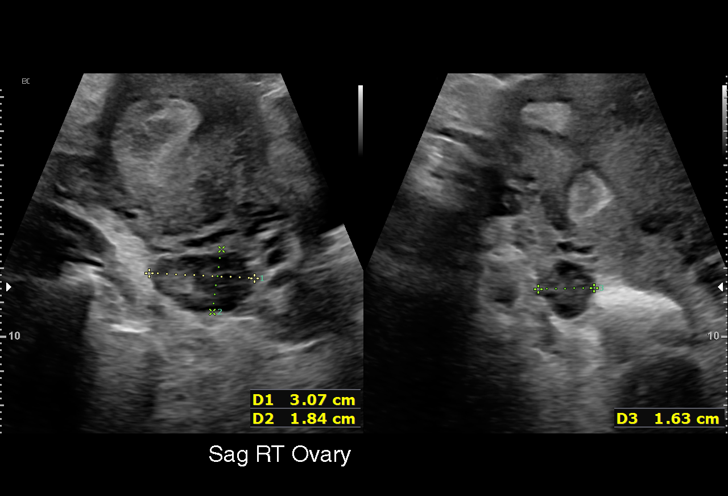
[im 21/25]
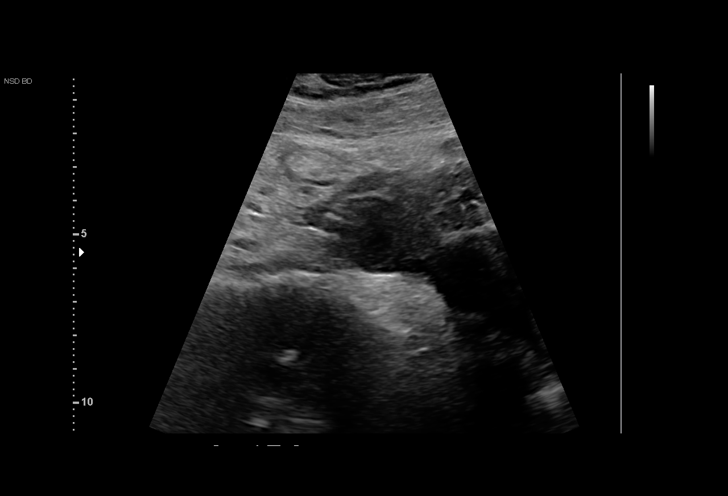
[im 23/25]
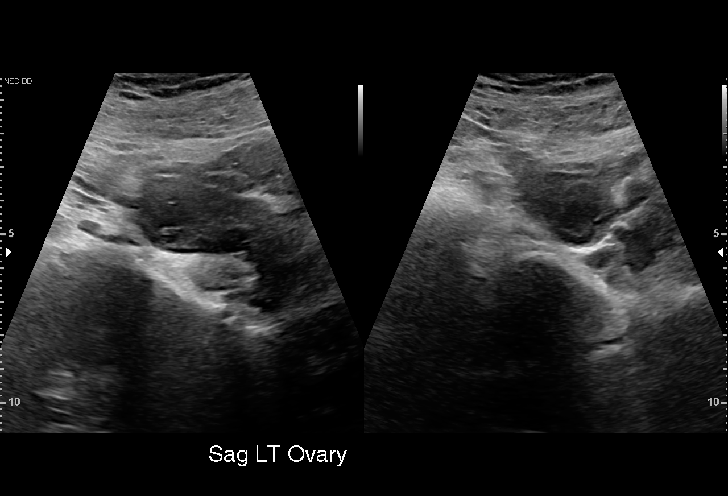
[im 25/25]
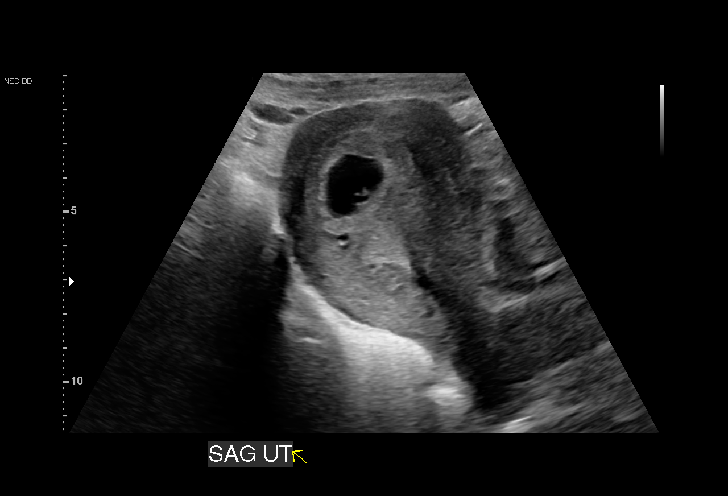

[15 of 25 positions shown; findings below may reference images not displayed]

FINDINGS: Intrauterine gestational sac: Single

Yolk sac:  Visualized

Embryo:  Visualized

Cardiac Activity: Visualized

Heart Rate: 116 bpm

CRL:  4.8 mm   6 w   1 d                  US EDC: 04/11/2022

Subchorionic hemorrhage:  Small

Maternal uterus/adnexae: Unremarkable appearance of uterus and
adnexal structures. Small corpus luteum on the LEFT.
IMPRESSION: Single intrauterine gestation, 6 weeks 1 day by crown-rump length of
4.8 mm with a fetal heart rate of 116 beats per minute.

Small subchorionic hemorrhage along the RIGHT aspect of the
gestation.

## 2023-10-22 DIAGNOSIS — Z862 Personal history of diseases of the blood and blood-forming organs and certain disorders involving the immune mechanism: Secondary | ICD-10-CM | POA: Insufficient documentation

## 2023-10-28 DIAGNOSIS — N62 Hypertrophy of breast: Secondary | ICD-10-CM | POA: Insufficient documentation

## 2023-11-14 DIAGNOSIS — D259 Leiomyoma of uterus, unspecified: Secondary | ICD-10-CM | POA: Insufficient documentation

## 2023-11-14 DIAGNOSIS — E66811 Obesity, class 1: Secondary | ICD-10-CM | POA: Insufficient documentation

## 2023-11-29 ENCOUNTER — Other Ambulatory Visit: Payer: Self-pay

## 2023-11-29 ENCOUNTER — Encounter (HOSPITAL_BASED_OUTPATIENT_CLINIC_OR_DEPARTMENT_OTHER): Payer: Self-pay | Admitting: Plastic Surgery

## 2023-12-02 MED ORDER — CHLORHEXIDINE GLUCONATE CLOTH 2 % EX PADS: 6.0000 | MEDICATED_PAD | Freq: Once | CUTANEOUS | Status: DC

## 2023-12-02 MED ORDER — CHLORHEXIDINE GLUCONATE CLOTH 2 % EX PADS
6.0000 | MEDICATED_PAD | Freq: Once | CUTANEOUS | Status: DC
Start: 2023-12-02 — End: 2023-12-06

## 2023-12-02 NOTE — Progress Notes (Signed)

## 2023-12-02 NOTE — H&P (Signed)
 Subjective Patient ID: Rebecca Salazar is a 28 y.o. female.     HPI   Returns for follow up discussion breast reduction. Current DDD but "bulges out' of this size. Reports over year history neck back shoulder pain and associated rashes. She has tried specialty fitted bras, OTC pain medication, regular activity, hygiene measures, heating pads for over 3 month trial without effect.    Patient delivered child 04/25/2023. Reports done having children, did not breast feed this child. Lives with kids age 12, 1 yr, and 6 mo. Reports they will spend summer with father's side of family. Reports her mother and father of children would assist with post operative care.    No prior MMG. Denies FH breast or ovarian ca.   Wt prior to last pregnancy 210 lb, reports current weight stable.    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   Review of Systems  Musculoskeletal:  Positive for back pain and neck pain.  Skin:  Positive for rash.  All other systems reviewed and are negative.     Objective Physical Exam  Cardiovascular: Normal rate, regular rhythm and normal heart sounds.    Pulmonary/Chest Effort normal and breath sounds normal.    Skin   Fitzpatrick 5      Lymph: no palpable axillary adenopathy   +shoulder grooving Breasts: no palpable masses, grade 3 ptosis bilateral. Left > right volume SN to nipple R 36.5 L 38.5 cm BW R 23 L 23 cm Nipple to IMF R 13 L 14 cm   Assessment/Plan Macromastia Chronic neck and back pain Intertrigo   At her age do not require MMG prior to surgery.   Chronic neck and back pain, intertrigo, in setting of macromastia that has failed conservative management over 3 month trial. The pain is not related to other diagnoses.There is a reasonable likelihood that the patient's symptoms are primarily due to macromastia. Breast reduction is likely to result in improvement of the chronic pain.  Reviewed reduction with anchor type scars, OP surgery, drains,  post operative visits and limitations, recovery. Diminished sensation nipple and breast skin, risk of nipple loss, wound healing problems, asymmetry, incidental carcinoma, changes with wt gain/loss, aging, unacceptable cosmetic appearance reviewed. Reviewed changes with pregnancy, effect on ability to breast feed. Reviewed scar maturation over months.  Reviewed cannot assure cup size. Discussed any complication with NAC would preclude her ability to breast feed in future.   Additional risks including but not limited to bleeding, hematoma, seroma, damage to adjacent structures, asymmetry, need for additional procedures, infection, wound healing problems, unacceptable cosmetic result, blood clots in legs or lungs reviewed.  Completed ASPS consent for breast reduction.   Drain teaching completed. Rx for Norco given.  Anticipate 865 g reduction from each breast.   Glenna Fellows, MD [facility]  Office/ physician access line after hours [phone]

## 2023-12-06 ENCOUNTER — Ambulatory Visit (HOSPITAL_BASED_OUTPATIENT_CLINIC_OR_DEPARTMENT_OTHER): Payer: Self-pay | Admitting: Anesthesiology

## 2023-12-06 ENCOUNTER — Encounter (HOSPITAL_BASED_OUTPATIENT_CLINIC_OR_DEPARTMENT_OTHER)
Admission: RE | Disposition: A | Discharge status: Home / Self Care | Payer: Self-pay | Source: Home / Self Care | Attending: Plastic Surgery

## 2023-12-06 ENCOUNTER — Ambulatory Visit (HOSPITAL_BASED_OUTPATIENT_CLINIC_OR_DEPARTMENT_OTHER)
Admission: RE | Admit: 2023-12-06 | Discharge: 2023-12-06 | Disposition: A | Discharge status: Home / Self Care | Payer: 59 | Attending: Plastic Surgery | Admitting: Plastic Surgery

## 2023-12-06 ENCOUNTER — Encounter (HOSPITAL_BASED_OUTPATIENT_CLINIC_OR_DEPARTMENT_OTHER): Payer: Self-pay | Admitting: Plastic Surgery

## 2023-12-06 ENCOUNTER — Other Ambulatory Visit: Payer: Self-pay

## 2023-12-06 DIAGNOSIS — N62 Hypertrophy of breast: Secondary | ICD-10-CM | POA: Diagnosis present

## 2023-12-06 DIAGNOSIS — N6001 Solitary cyst of right breast: Secondary | ICD-10-CM | POA: Insufficient documentation

## 2023-12-06 DIAGNOSIS — N6002 Solitary cyst of left breast: Secondary | ICD-10-CM | POA: Diagnosis not present

## 2023-12-06 DIAGNOSIS — N6021 Fibroadenosis of right breast: Secondary | ICD-10-CM | POA: Diagnosis not present

## 2023-12-06 DIAGNOSIS — L304 Erythema intertrigo: Secondary | ICD-10-CM | POA: Insufficient documentation

## 2023-12-06 DIAGNOSIS — M549 Dorsalgia, unspecified: Secondary | ICD-10-CM | POA: Diagnosis not present

## 2023-12-06 DIAGNOSIS — Z01818 Encounter for other preprocedural examination: Secondary | ICD-10-CM

## 2023-12-06 DIAGNOSIS — N6022 Fibroadenosis of left breast: Secondary | ICD-10-CM | POA: Insufficient documentation

## 2023-12-06 DIAGNOSIS — M542 Cervicalgia: Secondary | ICD-10-CM | POA: Diagnosis not present

## 2023-12-06 DIAGNOSIS — G8929 Other chronic pain: Secondary | ICD-10-CM | POA: Insufficient documentation

## 2023-12-06 HISTORY — PX: BREAST REDUCTION SURGERY: SHX8

## 2023-12-06 LAB — POCT PREGNANCY, URINE: Preg Test, Ur: NEGATIVE

## 2023-12-06 SURGERY — MAMMOPLASTY, REDUCTION
Anesthesia: General | Site: Breast | Laterality: Bilateral

## 2023-12-06 MED ORDER — PROPOFOL 10 MG/ML IV BOLUS
INTRAVENOUS | Status: DC | PRN
  Administered 2023-12-06: 200 mg via INTRAVENOUS

## 2023-12-06 MED ORDER — PROPOFOL 10 MG/ML IV BOLUS
INTRAVENOUS | Status: AC
  Filled 2023-12-06: qty 20

## 2023-12-06 MED ORDER — OXYCODONE HCL 5 MG/5ML PO SOLN: 5.0000 mg | Freq: Once | ORAL | Status: AC | PRN

## 2023-12-06 MED ORDER — AMISULPRIDE (ANTIEMETIC) 5 MG/2ML IV SOLN
10.0000 mg | Freq: Once | INTRAVENOUS | Status: AC
  Administered 2023-12-06: 10 mg via INTRAVENOUS

## 2023-12-06 MED ORDER — FENTANYL CITRATE (PF) 100 MCG/2ML IJ SOLN
INTRAMUSCULAR | Status: AC
  Filled 2023-12-06: qty 2

## 2023-12-06 MED ORDER — CEFAZOLIN SODIUM-DEXTROSE 2-4 GM/100ML-% IV SOLN
INTRAVENOUS | Status: AC
  Filled 2023-12-06: qty 100

## 2023-12-06 MED ORDER — CELECOXIB 200 MG PO CAPS
200.0000 mg | ORAL_CAPSULE | ORAL | Status: AC
  Administered 2023-12-06: 200 mg via ORAL

## 2023-12-06 MED ORDER — AMISULPRIDE (ANTIEMETIC) 5 MG/2ML IV SOLN
INTRAVENOUS | Status: AC
  Filled 2023-12-06: qty 4

## 2023-12-06 MED ORDER — FENTANYL CITRATE (PF) 100 MCG/2ML IJ SOLN
INTRAMUSCULAR | Status: DC | PRN
  Administered 2023-12-06 (×2): 100 ug via INTRAVENOUS
  Administered 2023-12-06: 50 ug via INTRAVENOUS

## 2023-12-06 MED ORDER — BUPIVACAINE HCL (PF) 0.5 % IJ SOLN
INTRAMUSCULAR | Status: DC | PRN
  Administered 2023-12-06: 15 mL

## 2023-12-06 MED ORDER — LIDOCAINE HCL (CARDIAC) PF 100 MG/5ML IV SOSY
PREFILLED_SYRINGE | INTRAVENOUS | Status: DC | PRN
  Administered 2023-12-06: 40 mg via INTRAVENOUS

## 2023-12-06 MED ORDER — MIDAZOLAM HCL 2 MG/2ML IJ SOLN
INTRAMUSCULAR | Status: AC
  Filled 2023-12-06: qty 2

## 2023-12-06 MED ORDER — SCOPOLAMINE 1 MG/3DAYS TD PT72
1.0000 | MEDICATED_PATCH | TRANSDERMAL | Status: DC
Start: 2023-12-06 — End: 2023-12-06

## 2023-12-06 MED ORDER — HYDROMORPHONE HCL 1 MG/ML IJ SOLN
INTRAMUSCULAR | Status: AC
  Filled 2023-12-06: qty 0.5

## 2023-12-06 MED ORDER — ACETAMINOPHEN 500 MG PO TABS
1000.0000 mg | ORAL_TABLET | Freq: Once | ORAL | Status: AC
  Administered 2023-12-06: 1000 mg via ORAL

## 2023-12-06 MED ORDER — LACTATED RINGERS IV SOLN: INTRAVENOUS | Status: DC

## 2023-12-06 MED ORDER — LIDOCAINE 2% (20 MG/ML) 5 ML SYRINGE
INTRAMUSCULAR | Status: AC
  Filled 2023-12-06: qty 5

## 2023-12-06 MED ORDER — DEXAMETHASONE SODIUM PHOSPHATE 10 MG/ML IJ SOLN
INTRAMUSCULAR | Status: AC
  Filled 2023-12-06: qty 1

## 2023-12-06 MED ORDER — MIDAZOLAM HCL 2 MG/2ML IJ SOLN: 0.5000 mg | Freq: Once | INTRAMUSCULAR | Status: DC | PRN

## 2023-12-06 MED ORDER — GABAPENTIN 300 MG PO CAPS
300.0000 mg | ORAL_CAPSULE | ORAL | Status: AC
  Administered 2023-12-06: 300 mg via ORAL

## 2023-12-06 MED ORDER — SUGAMMADEX SODIUM 200 MG/2ML IV SOLN
INTRAVENOUS | Status: DC | PRN
  Administered 2023-12-06: 200 mg via INTRAVENOUS

## 2023-12-06 MED ORDER — HYDROMORPHONE HCL 1 MG/ML IJ SOLN
0.5000 mg | INTRAMUSCULAR | Status: DC | PRN
  Administered 2023-12-06: 0.5 mg via INTRAVENOUS

## 2023-12-06 MED ORDER — DEXAMETHASONE SODIUM PHOSPHATE 10 MG/ML IJ SOLN
INTRAMUSCULAR | Status: DC | PRN
  Administered 2023-12-06: 10 mg via INTRAVENOUS

## 2023-12-06 MED ORDER — HYDROMORPHONE HCL 1 MG/ML IJ SOLN
0.2500 mg | INTRAMUSCULAR | Status: DC | PRN
  Administered 2023-12-06 (×4): 0.5 mg via INTRAVENOUS

## 2023-12-06 MED ORDER — OXYCODONE HCL 5 MG PO TABS
ORAL_TABLET | ORAL | Status: AC
Start: 2023-12-06 — End: ?
  Filled 2023-12-06: qty 1

## 2023-12-06 MED ORDER — ACETAMINOPHEN 500 MG PO TABS
ORAL_TABLET | ORAL | Status: AC
  Filled 2023-12-06: qty 2

## 2023-12-06 MED ORDER — GABAPENTIN 300 MG PO CAPS
ORAL_CAPSULE | ORAL | Status: AC
  Filled 2023-12-06: qty 1

## 2023-12-06 MED ORDER — KETAMINE HCL 10 MG/ML IJ SOLN
INTRAMUSCULAR | Status: DC | PRN
  Administered 2023-12-06: 30 mg via INTRAVENOUS
  Administered 2023-12-06: 20 mg via INTRAVENOUS

## 2023-12-06 MED ORDER — KETAMINE HCL 50 MG/5ML IJ SOSY
PREFILLED_SYRINGE | INTRAMUSCULAR | Status: AC
  Filled 2023-12-06: qty 5

## 2023-12-06 MED ORDER — ONDANSETRON HCL 4 MG/2ML IJ SOLN
INTRAMUSCULAR | Status: AC
  Filled 2023-12-06: qty 2

## 2023-12-06 MED ORDER — 0.9 % SODIUM CHLORIDE (POUR BTL) OPTIME
TOPICAL | Status: DC | PRN
  Administered 2023-12-06: 1000 mL

## 2023-12-06 MED ORDER — ONDANSETRON HCL 4 MG/2ML IJ SOLN
INTRAMUSCULAR | Status: DC | PRN
Start: 2023-12-06 — End: 2023-12-06
  Administered 2023-12-06: 4 mg via INTRAVENOUS

## 2023-12-06 MED ORDER — CEFAZOLIN SODIUM-DEXTROSE 2-4 GM/100ML-% IV SOLN
2.0000 g | INTRAVENOUS | Status: AC
  Administered 2023-12-06: 2 g via INTRAVENOUS

## 2023-12-06 MED ORDER — MIDAZOLAM HCL 5 MG/5ML IJ SOLN
INTRAMUSCULAR | Status: DC | PRN
  Administered 2023-12-06: 2 mg via INTRAVENOUS

## 2023-12-06 MED ORDER — ROCURONIUM BROMIDE 100 MG/10ML IV SOLN
INTRAVENOUS | Status: DC | PRN
  Administered 2023-12-06: 60 mg via INTRAVENOUS

## 2023-12-06 MED ORDER — CELECOXIB 200 MG PO CAPS
ORAL_CAPSULE | ORAL | Status: AC
  Filled 2023-12-06: qty 1

## 2023-12-06 MED ORDER — ACETAMINOPHEN 500 MG PO TABS: 1000.0000 mg | ORAL_TABLET | ORAL | Status: DC

## 2023-12-06 MED ORDER — OXYCODONE HCL 5 MG PO TABS
5.0000 mg | ORAL_TABLET | Freq: Once | ORAL | Status: AC | PRN
  Administered 2023-12-06: 5 mg via ORAL

## 2023-12-06 SURGICAL SUPPLY — 45 items
BINDER BREAST 3XL (GAUZE/BANDAGES/DRESSINGS) IMPLANT
BINDER BREAST LRG (GAUZE/BANDAGES/DRESSINGS) IMPLANT
BINDER BREAST MEDIUM (GAUZE/BANDAGES/DRESSINGS) IMPLANT
BINDER BREAST XLRG (GAUZE/BANDAGES/DRESSINGS) IMPLANT
BINDER BREAST XXLRG (GAUZE/BANDAGES/DRESSINGS) IMPLANT
BLADE SURG 10 STRL SS (BLADE) ×4 IMPLANT
BNDG GAUZE DERMACEA FLUFF 4 (GAUZE/BANDAGES/DRESSINGS) ×2 IMPLANT
CANISTER SUCT 1200ML W/VALVE (MISCELLANEOUS) ×1 IMPLANT
CHLORAPREP W/TINT 26 (MISCELLANEOUS) ×2 IMPLANT
COVER BACK TABLE 60X90IN (DRAPES) ×1 IMPLANT
COVER MAYO STAND STRL (DRAPES) ×1 IMPLANT
DERMABOND ADVANCED .7 DNX12 (GAUZE/BANDAGES/DRESSINGS) ×2 IMPLANT
DRAIN CHANNEL 15F RND FF W/TCR (WOUND CARE) IMPLANT
DRAIN CHANNEL 19F RND (DRAIN) IMPLANT
DRAPE TOP ARMCOVERS (MISCELLANEOUS) ×1 IMPLANT
DRAPE U-SHAPE 76X120 STRL (DRAPES) ×1 IMPLANT
DRAPE UTILITY XL STRL (DRAPES) ×1 IMPLANT
ELECT COATED BLADE 2.86 ST (ELECTRODE) ×1 IMPLANT
ELECT REM PT RETURN 9FT ADLT (ELECTROSURGICAL) ×1 IMPLANT
ELECTRODE REM PT RTRN 9FT ADLT (ELECTROSURGICAL) ×1 IMPLANT
EVACUATOR SILICONE 100CC (DRAIN) IMPLANT
GAUZE PAD ABD 8X10 STRL (GAUZE/BANDAGES/DRESSINGS) ×2 IMPLANT
GLOVE BIO SURGEON STRL SZ 6 (GLOVE) ×2 IMPLANT
GOWN STRL REUS W/ TWL LRG LVL3 (GOWN DISPOSABLE) ×2 IMPLANT
MARKER SKIN DUAL TIP RULER LAB (MISCELLANEOUS) IMPLANT
NDL HYPO 25X1 1.5 SAFETY (NEEDLE) ×1 IMPLANT
NEEDLE HYPO 25X1 1.5 SAFETY (NEEDLE) ×1 IMPLANT
NS IRRIG 1000ML POUR BTL (IV SOLUTION) ×1 IMPLANT
PACK BASIN DAY SURGERY FS (CUSTOM PROCEDURE TRAY) ×1 IMPLANT
PENCIL SMOKE EVACUATOR (MISCELLANEOUS) ×1 IMPLANT
PIN SAFETY STERILE (MISCELLANEOUS) ×1 IMPLANT
SHEET MEDIUM DRAPE 40X70 STRL (DRAPES) ×1 IMPLANT
SLEEVE SCD COMPRESS KNEE MED (STOCKING) ×1 IMPLANT
SPONGE T-LAP 18X18 ~~LOC~~+RFID (SPONGE) ×3 IMPLANT
STAPLER SKIN PROX WIDE 3.9 (STAPLE) ×1 IMPLANT
SUT ETHILON 2 0 FS 18 (SUTURE) IMPLANT
SUT MNCRL AB 4-0 PS2 18 (SUTURE) IMPLANT
SUT VIC AB 3-0 PS1 18XBRD (SUTURE) IMPLANT
SUT VIC AB 4-0 PS2 18 (SUTURE) IMPLANT
SYR BULB IRRIG 60ML STRL (SYRINGE) ×1 IMPLANT
SYR CONTROL 10ML LL (SYRINGE) ×1 IMPLANT
TOWEL GREEN STERILE FF (TOWEL DISPOSABLE) ×2 IMPLANT
TUBE CONNECTING 20X1/4 (TUBING) ×1 IMPLANT
UNDERPAD 30X36 HEAVY ABSORB (UNDERPADS AND DIAPERS) ×2 IMPLANT
YANKAUER SUCT BULB TIP NO VENT (SUCTIONS) ×1 IMPLANT

## 2023-12-06 NOTE — Anesthesia Preprocedure Evaluation (Addendum)
 Anesthesia Evaluation  Patient identified by MRN, date of birth, ID band Patient awake    Reviewed: Allergy & Precautions, NPO status , Patient's Chart, lab work & pertinent test results  History of Anesthesia Complications Negative for: history of anesthetic complications  Airway Mallampati: II  TM Distance: >3 FB Neck ROM: Full    Dental  (+) Teeth Intact, Dental Advisory Given   Pulmonary neg pulmonary ROS   breath sounds clear to auscultation       Cardiovascular negative cardio ROS  Rhythm:Regular Rate:Normal     Neuro/Psych negative neurological ROS     GI/Hepatic negative GI ROS, Neg liver ROS,,,  Endo/Other  BMI 32  Renal/GU negative Renal ROS     Musculoskeletal   Abdominal   Peds  Hematology negative hematology ROS (+)   Anesthesia Other Findings   Reproductive/Obstetrics                             Anesthesia Physical Anesthesia Plan  ASA: 2  Anesthesia Plan: General   Post-op Pain Management: Tylenol PO (pre-op)*   Induction: Intravenous  PONV Risk Score and Plan: 3 and Ondansetron, Dexamethasone and Scopolamine patch - Pre-op  Airway Management Planned: Oral ETT  Additional Equipment: None  Intra-op Plan:   Post-operative Plan: Extubation in OR  Informed Consent: I have reviewed the patients History and Physical, chart, labs and discussed the procedure including the risks, benefits and alternatives for the proposed anesthesia with the patient or authorized representative who has indicated his/her understanding and acceptance.     Dental advisory given  Plan Discussed with: CRNA and Surgeon  Anesthesia Plan Comments:        Anesthesia Quick Evaluation

## 2023-12-06 NOTE — Anesthesia Postprocedure Evaluation (Signed)
 Anesthesia Post Note  Patient: Courtnay Goodwine-[place]  Procedure(s) Performed: MAMMOPLASTY, REDUCTION (Bilateral: Breast)     Patient location during evaluation: PACU Anesthesia Type: General Level of consciousness: awake and alert Pain management: pain level controlled Vital Signs Assessment: post-procedure vital signs reviewed and stable Respiratory status: spontaneous breathing, nonlabored ventilation and respiratory function stable Cardiovascular status: blood pressure returned to baseline and stable Postop Assessment: no apparent nausea or vomiting Anesthetic complications: no  No notable events documented.  Last Vitals:  Vitals:   12/06/23 1515 12/06/23 1555  BP: 124/71 (!) 135/90  Pulse: 64 72  Resp: 14 18  Temp:  (!) 36.2 C  SpO2: 100% 94%    Last Pain:  Vitals:   12/06/23 1555  TempSrc: Temporal  PainSc:                  Jaysion Ramseyer,W. EDMOND

## 2023-12-06 NOTE — Transfer of Care (Signed)
 Immediate Anesthesia Transfer of Care Note  Patient: Rebecca Salazar  Procedure(s) Performed: MAMMOPLASTY, REDUCTION (Bilateral: Breast)  Patient Location: PACU  Anesthesia Type:General  Level of Consciousness: drowsy, patient cooperative, and responds to stimulation  Airway & Oxygen Therapy: Patient Spontanous Breathing and Patient connected to face mask oxygen  Post-op Assessment: Report given to RN and Post -op Vital signs reviewed and stable  Post vital signs: Reviewed and stable  Last Vitals:  Vitals Value Taken Time  BP    Temp    Pulse 77 12/06/23 1423  Resp    SpO2 93 % 12/06/23 1423  Vitals shown include unfiled device data.  Last Pain:  Vitals:   12/06/23 1017  TempSrc: Temporal  PainSc: 0-No pain         Complications: No notable events documented.

## 2023-12-06 NOTE — Op Note (Signed)
 Operative Note   DATE OF OPERATION: 3.14.2025  LOCATION: [facility]-outpatient  SURGICAL DIVISION: Plastic Surgery  PREOPERATIVE DIAGNOSES:  1. Macromastia 2. Chronic neck and back pain 3. Intertrigo  POSTOPERATIVE DIAGNOSES:  same  PROCEDURE:  Bilateral breast reduction  SURGEON: Glenna Fellows MD MBA  ASSISTANT: none  ANESTHESIA:  General.   EBL: 200 ml  COMPLICATIONS: None immediate.   INDICATIONS FOR PROCEDURE:  The patient, Rebecca Salazar, is a 28 y.o. female born on [birth year], is here for treatment chronic neck and back pain intertrigo in setting in of macromastia that has failed conservative measures.   FINDINGS: Right reduction 715 g Left reduction 707 g  DESCRIPTION OF PROCEDURE:  The patient was marked standing in the preoperative area to mark sternal notch, chest midline, anterior axillary lines, inframammary folds. The location of new nipple areolar complex was marked at level of on inframammary fold on anterior surface breast by palpation. This was marked symmetric over bilateral breasts. With aid of Wise pattern marker, location of new nipple areolar complex and vertical limbs (8 cm) were marked by displacement of breasts along meridian. The patient was taken to the operating room. SCDs were placed and IV antibiotics were given. The patient's operative site was prepped and draped in a sterile fashion. A time out was performed and all information was confirmed to be correct.     I began on left breast. Over left breast, superior medial pedicle marked and nipple areolar complex incised with 45 mm diameter marker. Pedicle deepithlialized and developed to chest wall. Breast tissue resected over lower pole. Medial and lateral flaps developed. Additional superior and lateral breast tissue excised. Breast tailor tacked closed.    I then directed attention to right breast where superior medial pedicle designed. NAC incised with 45 mm diameter marker. The pedicle was  deepithelialized. Pedicle developed to chest wall. Breast tissue resected over lower pole. Medial and lateral flaps developed. Additional superior and lateral breast tissue excised. Breast tailor tacked closed. Patient assessed for symmetry. Breast cavities irrigated and hemostasis obtained. Local anesthetic infiltrated throughout each breast. 15 Fr JP placed in each breast and secured with 2-0 nylon. Closure completed bilateral with 3-0 vicryl to approximate dermis along inframammary fold and vertical limb. NAC inset with 3-0 vicryl in dermis. Skin closure completed with 4-0 monocryl subcuticular throughout. Tissue adhesive applied. Dry dressing and breast binder applied   The patient was allowed to wake from anesthesia, extubated and taken to the recovery room in satisfactory condition.   SPECIMENS: right and left breast reduction  DRAINS: 15 Fr JP in right and left breast  Glenna Fellows, MD [facility]astic [facility] Reconstructive Surgery  Office/ physician access line after hours [phone]

## 2023-12-06 NOTE — Anesthesia Procedure Notes (Signed)
 Procedure Name: Intubation Date/Time: 12/06/2023 11:36 AM  Performed by: Thornell Mule, CRNAPre-anesthesia Checklist: Patient identified, Emergency Drugs available, Suction available and Patient being monitored Patient Re-evaluated:Patient Re-evaluated prior to induction Oxygen Delivery Method: Circle system utilized Preoxygenation: Pre-oxygenation with 100% oxygen Induction Type: IV induction Ventilation: Mask ventilation without difficulty Laryngoscope Size: Mac and 4 Grade View: Grade I Tube type: Oral Number of attempts: 1 Airway Equipment and Method: Stylet and Oral airway Placement Confirmation: ETT inserted through vocal cords under direct vision, positive ETCO2 and breath sounds checked- equal and bilateral Secured at: 21 cm Tube secured with: Tape Dental Injury: Teeth and Oropharynx as per pre-operative assessment

## 2023-12-06 NOTE — Discharge Instructions (Addendum)
  Post Anesthesia Home Care Instructions  Activity: Get plenty of rest for the remainder of the day. A responsible individual must stay with you for 24 hours following the procedure.  For the next 24 hours, DO NOT: -Drive a car -Advertising copywriter -Drink alcoholic beverages -Take any medication unless instructed by your physician -Make any legal decisions or sign important papers.  Meals: Start with liquid foods such as gelatin or soup. Progress to regular foods as tolerated. Avoid greasy, spicy, heavy foods. If nausea and/or vomiting occur, drink only clear liquids until the nausea and/or vomiting subsides. Call your physician if vomiting continues.  Special Instructions/Symptoms: Your throat may feel dry or sore from the anesthesia or the breathing tube placed in your throat during surgery. If this causes discomfort, gargle with warm salt water. The discomfort should disappear within 24 hours.  If you had a scopolamine patch placed behind your ear for the management of post- operative nausea and/or vomiting:  1. The medication in the patch is effective for 72 hours, after which it should be removed.  Wrap patch in a tissue and discard in the trash. Wash hands thoroughly with soap and water. 2. You may remove the patch earlier than 72 hours if you experience unpleasant side effects which may include dry mouth, dizziness or visual disturbances. 3. Avoid touching the patch. Wash your hands with soap and water after contact with the patch.  Next dose of tylenol if needed is at 4:16pm  Next dose of ibuprofen if needed is at 4:16pm

## 2023-12-06 NOTE — Interval H&P Note (Signed)
 History and Physical Interval Note:  12/06/2023 10:37 AM  Rebecca Salazar  has presented today for surgery, with the diagnosis of macromastia chronic neck and back pain.  The various methods of treatment have been discussed with the patient and family. After consideration of risks, benefits and other options for treatment, the patient has consented to  Procedure(s): MAMMOPLASTY, REDUCTION (Bilateral) as a surgical intervention.  The patient's history has been reviewed, patient examined, no change in status, stable for surgery.  I have reviewed the patient's chart and labs.  Questions were answered to the patient's satisfaction.     Irean Hong [employer]

## 2023-12-07 ENCOUNTER — Encounter (HOSPITAL_BASED_OUTPATIENT_CLINIC_OR_DEPARTMENT_OTHER): Payer: Self-pay | Admitting: Plastic Surgery

## 2023-12-09 LAB — SURGICAL PATHOLOGY

## 2024-02-10 DIAGNOSIS — D649 Anemia, unspecified: Secondary | ICD-10-CM | POA: Insufficient documentation

## 2024-02-10 DIAGNOSIS — D72829 Elevated white blood cell count, unspecified: Secondary | ICD-10-CM | POA: Insufficient documentation

## 2024-02-10 DIAGNOSIS — Z8271 Family history of polycystic kidney: Secondary | ICD-10-CM | POA: Insufficient documentation

## 2024-02-10 DIAGNOSIS — O35EXX0 Maternal care for other (suspected) fetal abnormality and damage, fetal genitourinary anomalies, not applicable or unspecified: Secondary | ICD-10-CM | POA: Insufficient documentation

## 2024-02-10 NOTE — ED Triage Notes (Signed)
"  My left ear is hurting after trying to clean it". No fever. No other acute illness or reason for visit.

## 2024-03-08 ENCOUNTER — Other Ambulatory Visit: Payer: Self-pay

## 2024-03-08 ENCOUNTER — Inpatient Hospital Stay (HOSPITAL_COMMUNITY)

## 2024-03-08 ENCOUNTER — Inpatient Hospital Stay (HOSPITAL_COMMUNITY)
Admission: AD | Admit: 2024-03-08 | Discharge: 2024-03-09 | Disposition: A | Discharge status: Home / Self Care | Attending: Obstetrics & Gynecology | Admitting: Obstetrics & Gynecology

## 2024-03-08 ENCOUNTER — Encounter (HOSPITAL_COMMUNITY): Payer: Self-pay

## 2024-03-08 DIAGNOSIS — Z3A01 Less than 8 weeks gestation of pregnancy: Secondary | ICD-10-CM | POA: Insufficient documentation

## 2024-03-08 DIAGNOSIS — O3680X0 Pregnancy with inconclusive fetal viability, not applicable or unspecified: Secondary | ICD-10-CM | POA: Diagnosis present

## 2024-03-08 LAB — COMPREHENSIVE METABOLIC PANEL WITH GFR
ALT: 12 U/L (ref 0–44)
AST: 16 U/L (ref 15–41)
Albumin: 3.4 g/dL — ABNORMAL LOW (ref 3.5–5.0)
Alkaline Phosphatase: 69 U/L (ref 38–126)
Anion gap: 8 (ref 5–15)
BUN: 10 mg/dL (ref 6–20)
CO2: 24 mmol/L (ref 22–32)
Calcium: 8.8 mg/dL — ABNORMAL LOW (ref 8.9–10.3)
Chloride: 103 mmol/L (ref 98–111)
Creatinine, Ser: 0.63 mg/dL (ref 0.44–1.00)
GFR, Estimated: >60 mL/min (ref >60)
Glucose, Bld: 91 mg/dL (ref 70–99)
Potassium: 3.7 mmol/L (ref 3.5–5.1)
Sodium: 135 mmol/L (ref 135–145)
Total Bilirubin: 0.4 mg/dL (ref 0.0–1.2)
Total Protein: 6.9 g/dL (ref 6.5–8.1)

## 2024-03-08 LAB — URINALYSIS, ROUTINE W REFLEX MICROSCOPIC
Bacteria, UA: NONE SEEN
Bilirubin Urine: NEGATIVE
Glucose, UA: NEGATIVE mg/dL
Ketones, ur: NEGATIVE mg/dL
Leukocytes,Ua: NEGATIVE
Nitrite: NEGATIVE
Protein, ur: 30 mg/dL — AB
RBC / HPF: >50 RBC/hpf (ref 0–5)
Specific Gravity, Urine: 1.031 — ABNORMAL HIGH (ref 1.005–1.030)
pH: 5 (ref 5.0–8.0)

## 2024-03-08 LAB — ABO/RH: ABO/RH(D): O POS

## 2024-03-08 LAB — WET PREP, GENITAL
Sperm: NONE SEEN
Trich, Wet Prep: NONE SEEN
WBC, Wet Prep HPF POC: <10 (ref <10)
Yeast Wet Prep HPF POC: NONE SEEN

## 2024-03-08 LAB — CBC
HCT: 28.4 % — ABNORMAL LOW (ref 36.0–46.0)
Hemoglobin: 8.5 g/dL — ABNORMAL LOW (ref 12.0–15.0)
MCH: 21.4 pg — ABNORMAL LOW (ref 26.0–34.0)
MCHC: 29.9 g/dL — ABNORMAL LOW (ref 30.0–36.0)
MCV: 71.5 fL — ABNORMAL LOW (ref 80.0–100.0)
Platelets: 446 10*3/uL — ABNORMAL HIGH (ref 150–400)
RBC: 3.97 MIL/uL (ref 3.87–5.11)
RDW: 20.2 % — ABNORMAL HIGH (ref 11.5–15.5)
WBC: 16.3 10*3/uL — ABNORMAL HIGH (ref 4.0–10.5)
nRBC: 0 % (ref 0.0–0.2)

## 2024-03-08 LAB — POCT PREGNANCY, URINE: Preg Test, Ur: POSITIVE — AB

## 2024-03-08 LAB — HCG, QUANTITATIVE, PREGNANCY: hCG, Beta Chain, Quant, S: 4662 m[IU]/mL — ABNORMAL HIGH (ref <5)

## 2024-03-08 NOTE — MAU Note (Signed)
 Rebecca Salazar is a 28 y.o. at Unknown here in MAU reporting: found out she was pregnant this weekend. Started having light VB yesterday but has gotten heavier and noticing large and small blood clots in the toilet after using the restroom. Reports saturating a pad within an hour. Also having cramping   LMP: May 1 Onset of complaint: yesterday  Pain score: 6 Vitals:   03/08/24 2146  BP: (!) 137/90  Pulse: 82  Resp: 17  Temp: 98.6 F (37 C)  SpO2: 100%     FHT: NA  Lab orders placed from triage: urine pregnancy; UA

## 2024-03-08 NOTE — MAU Provider Note (Signed)
 None     S [name] is a 28 y.o. [phone] pregnant female at [redacted]w[redacted]d who presents to MAU today with complaint of bleeding that started yesterday, and was light in amount, but was bright red. It has increased today and is accompanied by pain that started about 2-hours ago. She has not tried anything for the pain. She reports her last meal was around 1800 - pizza. She has not had an ultrasound this pregnancy. Last intercourse several days ago.   Receives care at [facility], has not yet had an appointment.  Pertinent items noted in HPI and remainder of comprehensive ROS otherwise negative.   O BP 134/86 (BP Location: Right Arm)   Pulse (!) 59   Temp 98.6 F (37 C) (Oral)   Resp 17   Ht 5' 9 (1.753 m)   Wt 96.4 kg   LMP 01/23/2024 (Exact Date)   SpO2 100%   BMI 31.40 kg/m  Physical Exam Vitals and nursing note reviewed.  Constitutional:      Appearance: Normal appearance.  HENT:     Head: Normocephalic.   Cardiovascular:     Rate and Rhythm: Normal rate and regular rhythm.     Pulses: Normal pulses.     Heart sounds: Normal heart sounds.  Pulmonary:     Effort: Pulmonary effort is normal.     Breath sounds: Normal breath sounds.   Skin:    General: Skin is warm and dry.     Capillary Refill: Capillary refill takes less than 2 seconds.   Neurological:     General: No focal deficit present.     Mental Status: She is alert and oriented to person, place, and time.      MDM: Reviewed EMR, PE, patient history [place] workup  Consulted Dr. Randolm Salazar with POC: follow up 03/11/2024 AM in MAU for HcG. If rising, consider methotrexate. If decreasing likely SAB.  MAU Course:  A Pregnancy of unknown anatomic location - Plan: Discharge patient  Medical screening exam complete  P Discharge from MAU in stable condition with strict bleeding and infection precautions Follow up at MAU 03/11/24 AM for beta HCG. If rising, consider methotrexate. If declining, likely SAB.    Allergies as of 03/09/2024   No Known Allergies      Medication List     TAKE these medications    acetaminophen  325 MG tablet Commonly known as: Tylenol  Take 2 tablets (650 mg total) by mouth every 4 (four) hours as needed (for pain scale < 4).   acyclovir 400 MG tablet Commonly known as: ZOVIRAX Take 1 tablet by mouth every 8 (eight) hours.   HYDROcodone-acetaminophen  5-325 MG tablet Commonly known as: NORCO/VICODIN Take 1 tablet by mouth every 6 (six) hours as needed for moderate pain (pain score 4-6).   ibuprofen  400 MG tablet Commonly known as: ADVIL  Take 600 mg by mouth every 8 (eight) hours as needed.   metroNIDAZOLE  500 MG tablet Commonly known as: FLAGYL  Take 500 mg by mouth 2 (two) times daily.   valACYclovir 500 MG tablet Commonly known as: VALTREX Take 500 mg by mouth 2 (two) times daily.        Rebecca Bunk, MSN, CNM 03/09/2024 3:53 AM  Certified Nurse Midwife, [facility]

## 2024-03-09 DIAGNOSIS — O3680X0 Pregnancy with inconclusive fetal viability, not applicable or unspecified: Secondary | ICD-10-CM

## 2024-03-09 DIAGNOSIS — Z3A01 Less than 8 weeks gestation of pregnancy: Secondary | ICD-10-CM

## 2024-03-09 LAB — GC/CHLAMYDIA PROBE AMP (~~LOC~~) NOT AT ARMC
Chlamydia: NEGATIVE
Comment: NEGATIVE
Comment: NORMAL
Neisseria Gonorrhea: NEGATIVE

## 2024-03-09 NOTE — Discharge Instructions (Signed)
 Rebecca Salazar,  Thank you for letting us  care for you in MAU tonight. It was determined that your pregnancy is likely abnormal. We would like to follow up Wednesday morning in the office for labs. I will make this appointment and you will be able to see it in your MyChart.   Please come back with heavy bleeding, filling a pad in an hour or less, increasing pain or signs of infection.  Thank you for trusting us  to care for you, Abraham Hoffmann, Midwife
